# Patient Record
Sex: Male | Born: 1980 | Race: White | Hispanic: No | Marital: Married | Smoking: Former smoker
Health system: Southern US, Community
[De-identification: ages and names within clinical notes are randomized; demographics above are authoritative.]

## PROBLEM LIST (undated history)

## (undated) DIAGNOSIS — T7840XA Allergy, unspecified, initial encounter: Secondary | ICD-10-CM

## (undated) HISTORY — DX: Allergy, unspecified, initial encounter: T78.40XA

## (undated) HISTORY — PX: FRACTURE SURGERY: SHX138

---
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
## 2003-09-19 ENCOUNTER — Ambulatory Visit (HOSPITAL_BASED_OUTPATIENT_CLINIC_OR_DEPARTMENT_OTHER): Admission: RE | Admit: 2003-09-19 | Discharge: 2003-09-19 | Payer: Self-pay | Admitting: Orthopedic Surgery

## 2004-08-16 ENCOUNTER — Emergency Department (HOSPITAL_COMMUNITY): Admission: EM | Admit: 2004-08-16 | Discharge: 2004-08-16 | Payer: Self-pay | Admitting: Emergency Medicine

## 2004-11-20 ENCOUNTER — Ambulatory Visit (HOSPITAL_COMMUNITY): Admission: RE | Admit: 2004-11-20 | Discharge: 2004-11-20 | Payer: Self-pay | Admitting: Gastroenterology

## 2004-12-12 ENCOUNTER — Encounter: Admission: RE | Admit: 2004-12-12 | Discharge: 2004-12-12 | Payer: Self-pay | Admitting: Gastroenterology

## 2012-03-28 ENCOUNTER — Ambulatory Visit (INDEPENDENT_AMBULATORY_CARE_PROVIDER_SITE_OTHER): Payer: BC Managed Care – PPO | Admitting: Internal Medicine

## 2012-03-28 VITALS — BP 136/75 | HR 82 | Temp 98.4°F | Resp 18 | Ht 74.0 in | Wt 194.0 lb

## 2012-03-28 DIAGNOSIS — J329 Chronic sinusitis, unspecified: Secondary | ICD-10-CM

## 2012-03-28 DIAGNOSIS — R05 Cough: Secondary | ICD-10-CM

## 2012-03-28 MED ORDER — AMOXICILLIN 500 MG PO CAPS: 1000.0000 mg | ORAL_CAPSULE | Freq: Three times a day (TID) | ORAL | Status: DC

## 2012-03-28 NOTE — Progress Notes (Signed)
  Subjective:    Patient ID: Willie Moore, male    DOB: October 20, 1980, 32 y.o.   MRN: [id number]  HPI  Pt complains of a constant left sided HA since Thurs. Also having congestion and fatigue ST in the a.m. Ears popping. Early morning Productive cough, noticed blood in the mucus today. Cough not keeping pt awake at night. 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 Mild h/o of EIA  Childhood h/o GERD, but no problems as an adult. As a child his allergy symptoms and sinus problems were attributed to his GERD  Review of Systems     Objective:   Physical Exam BP 136/75  Pulse 82  Temp 98.4 F (36.9 C) (Oral)  Resp 18  Ht 6\' 2"  (1.88 m)  Wt 194 lb (87.998 kg)  BMI 24.91 kg/m2  Conjunctiva clear/TMs clear Nares boggy with purulent so on the right Maxillary area is tender Throat clear No nodes Chest clear        Assessment & Plan:  Sinusitis Cough Amox 1,000mg  BID x 10d Continue Mucinex Steam

## 2013-01-02 ENCOUNTER — Ambulatory Visit (INDEPENDENT_AMBULATORY_CARE_PROVIDER_SITE_OTHER): Payer: BC Managed Care – PPO | Admitting: Internal Medicine

## 2013-01-02 VITALS — BP 118/80 | HR 96 | Temp 98.4°F | Resp 16 | Ht 74.0 in | Wt 194.0 lb

## 2013-01-02 DIAGNOSIS — J329 Chronic sinusitis, unspecified: Secondary | ICD-10-CM

## 2013-01-02 DIAGNOSIS — J9801 Acute bronchospasm: Secondary | ICD-10-CM

## 2013-01-02 MED ORDER — AMOXICILLIN-POT CLAVULANATE 875-125 MG PO TABS: 1.0000 | ORAL_TABLET | Freq: Two times a day (BID) | ORAL | Status: DC

## 2013-01-02 MED ORDER — HYDROCODONE-HOMATROPINE 5-1.5 MG/5ML PO SYRP: 5.0000 mL | ORAL_SOLUTION | Freq: Four times a day (QID) | ORAL | Status: DC | PRN

## 2013-01-02 MED ORDER — PREDNISONE 20 MG PO TABS: 20.0000 mg | ORAL_TABLET | Freq: Every day | ORAL | Status: DC

## 2013-01-02 NOTE — Progress Notes (Signed)
Subjective:    Patient ID: Willie Moore, male    DOB: 11-11-1980, 32 y.o.   MRN: [id number]  HPI Cough has gotten better, but has not completely gone away since 1/14. Has been worse for last week. Had no voice for 3 days, sore throat, ear pain. Having a lot of mucous production in the morning. Dark brown and green, some blood. Little drainage down back of throat. No nasal drainage, feels dry. No nasal congestion or pressure.  No fever, sweating x 4-5 day, chills.Sneezes frequently. Very fatigued.  Sometimes feels SOB, feels heavy in chest with burning, tight muscles, wheezing comes and goes. Felt SOB while walking today. Chest felt heavy, went down to knees, saw stars and felt like he was blacking out. Lasted 5-10 seconds. Felt light headed when he stood up with tingling throughout whole body. No loss of bowels or bladder today. No nausea or vomiting. Cough wakes him up at night. Two to three years ago was driving and had same symptoms and was able to pull off to side of road. Lasted much longer than this episode. At that time, lost control of bladder. Did not seek medical care at that time  Throat feels better today. Still has GERD, doesn't take any meds. Always gassy and burps a lot.Has taken probiotics and watching diet with some relief.  Father and aunt have an uncurable disease, patient not sure of the name. He thinks he is now having similar symptoms with tremors.   Patient under a lot of stress, works 60 hours a week.  Review of Systems  Constitutional: Positive for chills, diaphoresis, activity change and fatigue. Negative for fever, appetite change and unexpected weight change.  HENT: Positive for ear pain, postnasal drip, sneezing, sore throat and voice change. Negative for congestion, rhinorrhea and sinus pressure.   Respiratory: Positive for cough, chest tightness, shortness of breath and wheezing.   Cardiovascular: Positive for chest pain.  Gastrointestinal: Positive for  abdominal pain. Negative for nausea and vomiting.  Neurological: Positive for light-headedness and numbness. Negative for headaches.  Psychiatric/Behavioral: Positive for sleep disturbance.       Objective:   Physical Exam  Nursing note and vitals reviewed. Constitutional: He is oriented to person, place, and time. He appears well-developed and well-nourished. No distress.  HENT:  Right Ear: External ear normal.  Left Ear: External ear normal.  Left ear with occlusive wax. Right ear with opaque TM, erythematous canal. Pharynx with thick, yellow mucous.  Eyes: Conjunctivae are normal. Right eye exhibits no discharge. Left eye exhibits no discharge.  Neck: Normal range of motion. Neck supple.  Cardiovascular: Normal rate, regular rhythm and normal heart sounds.   Pulmonary/Chest: Effort normal. No respiratory distress. He has wheezes. He has no rales. He exhibits no tenderness.  Upper anterior expiratory wheezes. Posterior chest clear.  Musculoskeletal: Normal range of motion. He exhibits no edema.  Lymphadenopathy:    He has no cervical adenopathy.  Neurological: He is alert and oriented to person, place, and time.  Skin: Skin is warm and dry. He is not diaphoretic.  Psychiatric: He has a normal mood and affect. His behavior is normal. Judgment and thought content normal.      Assessment & Plan:  Sinus infection  Bronchospasm  Meds ordered this encounter  Medications  . amoxicillin-clavulanate (AUGMENTIN) 875-125 MG per tablet    Sig: Take 1 tablet by mouth 2 (two) times daily.    Dispense:  20 tablet    Refill:  0  .  predniSONE (DELTASONE) 20 MG tablet    Sig: Take 1 tablet (20 mg total) by mouth daily. 3/3/2/2/1/1 single daily dose for 6 days    Dispense:  12 tablet    Refill:  0  . HYDROcodone-homatropine (HYCODAN) 5-1.5 MG/5ML syrup    Sig: Take 5 mLs by mouth every 6 (six) hours as needed for cough.    Dispense:  120 mL    Refill:  0   1- discussed diagnosis with  patient. Will take meds as prescribed. If no improvement in 2 weeks, return for follow up/labs. If he has another episode like today once infection cleared, return for follow up. Return sooner if worsening symptoms. 2- no tremor observed but will evaluate if this recurs  DG/RPD

## 2013-05-20 ENCOUNTER — Ambulatory Visit (INDEPENDENT_AMBULATORY_CARE_PROVIDER_SITE_OTHER): Payer: BC Managed Care – PPO | Admitting: Family Medicine

## 2013-05-20 ENCOUNTER — Ambulatory Visit: Payer: BC Managed Care – PPO

## 2013-05-20 VITALS — BP 128/62 | HR 87 | Temp 98.6°F | Resp 16 | Ht 73.75 in | Wt 190.6 lb

## 2013-05-20 DIAGNOSIS — S29012A Strain of muscle and tendon of back wall of thorax, initial encounter: Secondary | ICD-10-CM

## 2013-05-20 DIAGNOSIS — M549 Dorsalgia, unspecified: Secondary | ICD-10-CM

## 2013-05-20 DIAGNOSIS — M62830 Muscle spasm of back: Secondary | ICD-10-CM

## 2013-05-20 DIAGNOSIS — M5380 Other specified dorsopathies, site unspecified: Secondary | ICD-10-CM

## 2013-05-20 DIAGNOSIS — S239XXA Sprain of unspecified parts of thorax, initial encounter: Secondary | ICD-10-CM

## 2013-05-20 MED ORDER — CYCLOBENZAPRINE HCL 5 MG PO TABS: ORAL_TABLET | ORAL | Status: DC

## 2013-05-20 MED ORDER — HYDROCODONE-ACETAMINOPHEN 5-325 MG PO TABS: 1.0000 | ORAL_TABLET | Freq: Four times a day (QID) | ORAL | Status: DC | PRN

## 2013-05-20 MED ORDER — MELOXICAM 7.5 MG PO TABS: 7.5000 mg | ORAL_TABLET | Freq: Every day | ORAL | Status: DC

## 2013-05-20 NOTE — Progress Notes (Signed)
Subjective:    Patient ID: Willie Moore, male    DOB: Nov 13, 1980, 33 y.o.   MRN: [id number]  HPI Willie Moore is a 33 y.o. 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 3 weekends ago, after the exercise - back locked up for about 4 hrs NKI - treated with heat/massage first time. Improved after 4-6 hours, but always felt pinch. Continued to work out.    Picked up 32yo son today - felt pop in back - middle of back and to right, felt pain move up and down about 5 inches. Still sore now, same area as initial pain and area of remaining knot. Burning sensation and pain in area.   [place]I [place]   Tx: none  No bowel or bladder incontinence, no saddle anesthesia, no lower extremity weakness.   SH: Acupuncturistelectrical engineer.   There are no active problems to display for this patient.  Past Medical History  Diagnosis Date  . Allergy    Past Surgical History  Procedure Laterality Date  . Fracture surgery     No Known Allergies Prior to Admission medications   Medication Sig Start Date End Date Taking? Authorizing Provider  amoxicillin-clavulanate (AUGMENTIN) 875-125 MG per tablet Take 1 tablet by mouth 2 (two) times daily. 01/02/13   Tonye Pearsonobert P Doolittle, MD  HYDROcodone-homatropine [facility](HYCODAN) 5-1.5 MG/5ML syrup Take 5 mLs by mouth every 6 (six) hours as needed for cough. 01/02/13   Tonye Pearsonobert P Doolittle, MD  predniSONE (DELTASONE) 20 MG tablet Take 1 tablet (20 mg total) by mouth daily. 3/3/2/2/1/1 single daily dose for 6 days 01/02/13   Tonye Pearsonobert P Doolittle, MD   History   Social History  . Marital Status: Single    Spouse Name: N/A    Number of Children: N/A  . Years of Education: N/A   Occupational History  . Not on file.   Social History Main Topics  . Smoking status: Current Every Day Smoker  . Smokeless tobacco: Not on file  . Alcohol Use: Yes  . Drug Use: No  . Sexual Activity: Yes   Other Topics Concern  . Not on file    Social History Narrative  . No narrative on file    Review of Systems  Genitourinary: Negative for decreased urine volume, difficulty urinating and testicular pain.  Musculoskeletal: Positive for back pain and myalgias.  Skin: Negative for rash.  Neurological: Negative for weakness.       Objective:   Physical Exam  Vitals reviewed. Constitutional: He appears well-developed and well-nourished.  HENT:  Head: Normocephalic and atraumatic.  Neck: Normal range of motion.  Pulmonary/Chest: Effort normal.  Abdominal: Soft. There is no tenderness.  Musculoskeletal: He exhibits tenderness.       Thoracic back: He exhibits decreased range of motion, tenderness and spasm. He exhibits no bony tenderness.       Lumbar back: He exhibits tenderness and spasm. He exhibits normal range of motion and no bony tenderness.       Back:  Neurological: He is alert. He has normal strength. No sensory deficit.  Reflex Scores:      Patellar reflexes are 2+ on the right side and 2+ on the left side.      Achilles reflexes are 2+ on the right side and 2+ on the left side. Able to heel and toe walk without difficulty.  Skin: Skin is warm and dry.  Psychiatric: He has a  normal mood and affect. His behavior is normal.     [facility] reading (PRIMARY) by  Dr. Neva Seat: T spine: no acute findings.  LS spine: No acute findings        Assessment & Plan:   Willie Moore is a 33 y.o. male Back pain - Plan: DG Lumbar Spine Complete, DG Thoracic Spine 2 View, meloxicam (MOBIC) 7.5 MG tablet, HYDROcodone-acetaminophen (NORCO/VICODIN) 5-325 MG per tablet  Strain of mid-back - Plan: meloxicam (MOBIC) 7.5 MG tablet, HYDROcodone-acetaminophen (NORCO/VICODIN) 5-325 MG per tablet  Muscle spasm of back - Plan: cyclobenzaprine (FLEXERIL) 5 MG tablet  Paraspinal strain, spasm likely.  DDX includes HNP, but initial tx with heat/ice, gentle rom, mobic, flexeril and if needed hydrocodone (no recent use - denies  problem/intolerance with narcotic meds in past).  If not improving into next week, consider MRI vs prednisone trial. Sooner if worse.   Meds ordered this encounter  Medications  . cyclobenzaprine (FLEXERIL) 5 MG tablet    Sig: 1 pill by mouth up to every 8 hours as needed. Start with one pill by mouth each bedtime as needed due to sedation    Dispense:  15 tablet    Refill:  0  . meloxicam (MOBIC) 7.5 MG tablet    Sig: Take 1 tablet (7.5 mg total) by mouth daily.    Dispense:  30 tablet    Refill:  0  . HYDROcodone-acetaminophen (NORCO/VICODIN) 5-325 MG per tablet    Sig: Take 1 tablet by mouth every 6 (six) hours as needed for moderate pain.    Dispense:  15 tablet    Refill:  0   Patient Instructions  You likely have a sprained ligament or strained muscle in the back, which can lead to some muscle spasm as well. Try the mobic each morning (do not combine with other over the counter pain relievers), flexeril at night if needed.  Heat or ice to area as needed and the other treatments and exercises in the back care manual as tolerated. If needed for more severe pain - hydrocodone prescribed, but be careful combining this with other sedating medicines such as the muscle relaxant. Heat or ice to affected area, and gentle range of motion. Recheck in 5-7 days if not improving as may need MRI.   Return to the clinic or go to the nearest emergency room if any of your symptoms worsen or new symptoms occur.  Back Pain, Adult Low back pain is very common. About 1 in 5 people have back pain.The cause of low back pain is rarely dangerous. The pain often gets better over time.About half of people with a sudden onset of back pain feel better in just 2 weeks. About 8 in 10 people feel better by 6 weeks.  CAUSES Some common causes of back pain include:  Strain of the muscles or ligaments supporting the spine.  Wear and tear (degeneration) of the spinal discs.  Arthritis.  Direct injury to the  back. DIAGNOSIS Most of the time, the direct cause of low back pain is not known.However, back pain can be treated effectively even when the exact cause of the pain is unknown.Answering your caregiver's questions about your overall health and symptoms is one of the most accurate ways to make sure the cause of your pain is not dangerous. If your caregiver needs more information, he or she may order lab work or imaging tests (X-rays or MRIs).However, even if imaging tests show changes in your back, this usually does  not require surgery. HOME CARE INSTRUCTIONS For many people, back pain returns.Since low back pain is rarely dangerous, it is often a condition that people can learn to [facility] their own.   Remain active. It is stressful on the back to sit or stand in one place. Do not sit, drive, or stand in one place for more than 30 minutes at a time. Take short walks on level surfaces as soon as pain allows.Try to increase the length of time you walk each day.  Do not stay in bed.Resting more than 1 or 2 days can delay your recovery.  Do not avoid exercise or work.Your body is made to move.It is not dangerous to be active, even though your back may hurt.Your back will likely heal faster if you return to being active before your pain is gone.  Pay attention to your body when you bend and lift. Many people have less discomfortwhen lifting if they bend their knees, keep the load close to their bodies,and avoid twisting. Often, the most comfortable positions are those that put less stress on your recovering back.  Find a comfortable position to sleep. Use a firm mattress and lie on your side with your knees slightly bent. If you lie on your back, put a pillow under your knees.  Only take over-the-counter or prescription medicines as directed by your caregiver. Over-the-counter medicines to reduce pain and inflammation are often the most helpful.Your caregiver may prescribe muscle relaxant  drugs.These medicines help dull your pain so you can more quickly return to your normal activities and healthy exercise.  Put ice on the injured area.  Put ice in a plastic bag.  Place a towel between your skin and the bag.  Leave the ice on for 15-20 minutes, 03-04 times a day for the first 2 to 3 days. After that, ice and heat may be alternated to reduce pain and spasms.  Ask your caregiver about trying back exercises and gentle massage. This may be of some benefit.  Avoid feeling anxious or stressed.Stress increases muscle tension and can worsen back pain.It is important to recognize when you are anxious or stressed and learn ways to manage it.Exercise is a great option. SEEK MEDICAL CARE IF:  You have pain that is not relieved with rest or medicine.  You have pain that does not improve in 1 week.  You have new symptoms.  You are generally not feeling well. SEEK IMMEDIATE MEDICAL CARE IF:   You have pain that radiates from your back into your legs.  You develop new bowel or bladder control problems.  You have unusual weakness or numbness in your arms or legs.  You develop nausea or vomiting.  You develop abdominal pain.  You feel faint. Document Released: 03/02/2005 Document Revised: 09/01/2011 Document Reviewed: 07/21/2010 [facility] Patient Information 2014 [place], [place].

## 2013-05-20 NOTE — Patient Instructions (Signed)
You likely have a sprained ligament or strained muscle in the back, which can lead to some muscle spasm as well. Try the mobic each morning (do not combine with other over the counter pain relievers), flexeril at night if needed.  Heat or ice to area as needed and the other treatments and exercises in the back care manual as tolerated. If needed for more severe pain - hydrocodone prescribed, but be careful combining this with other sedating medicines such as the muscle relaxant. Heat or ice to affected area, and gentle range of motion. Recheck in 5-7 days if not improving as may need MRI.   Return to the clinic or go to the nearest emergency room if any of your symptoms worsen or new symptoms occur.  Back Pain, Adult Low back pain is very common. About 1 in 5 people have back pain.The cause of low back pain is rarely dangerous. The pain often gets better over time.About half of people with a sudden onset of back pain feel better in just 2 weeks. About 8 in 10 people feel better by 6 weeks.  CAUSES Some common causes of back pain include:  Strain of the muscles or ligaments supporting the spine.  Wear and tear (degeneration) of the spinal discs.  Arthritis.  Direct injury to the back. DIAGNOSIS Most of the time, the direct cause of low back pain is not known.However, back pain can be treated effectively even when the exact cause of the pain is unknown.Answering your caregiver's questions about your overall health and symptoms is one of the most accurate ways to make sure the cause of your pain is not dangerous. If your caregiver needs more information, he or she may order lab work or imaging tests (X-rays or MRIs).However, even if imaging tests show changes in your back, this usually does not require surgery. HOME CARE INSTRUCTIONS For many people, back pain returns.Since low back pain is rarely dangerous, it is often a condition that people can learn to [facility] their own.   Remain  active. It is stressful on the back to sit or stand in one place. Do not sit, drive, or stand in one place for more than 30 minutes at a time. Take short walks on level surfaces as soon as pain allows.Try to increase the length of time you walk each day.  Do not stay in bed.Resting more than 1 or 2 days can delay your recovery.  Do not avoid exercise or work.Your body is made to move.It is not dangerous to be active, even though your back may hurt.Your back will likely heal faster if you return to being active before your pain is gone.  Pay attention to your body when you bend and lift. Many people have less discomfortwhen lifting if they bend their knees, keep the load close to their bodies,and avoid twisting. Often, the most comfortable positions are those that put less stress on your recovering back.  Find a comfortable position to sleep. Use a firm mattress and lie on your side with your knees slightly bent. If you lie on your back, put a pillow under your knees.  Only take over-the-counter or prescription medicines as directed by your caregiver. Over-the-counter medicines to reduce pain and inflammation are often the most helpful.Your caregiver may prescribe muscle relaxant drugs.These medicines help dull your pain so you can more quickly return to your normal activities and healthy exercise.  Put ice on the injured area.  Put ice in a plastic bag.  Place  a towel between your skin and the bag.  Leave the ice on for 15-20 minutes, 03-04 times a day for the first 2 to 3 days. After that, ice and heat may be alternated to reduce pain and spasms.  Ask your caregiver about trying back exercises and gentle massage. This may be of some benefit.  Avoid feeling anxious or stressed.Stress increases muscle tension and can worsen back pain.It is important to recognize when you are anxious or stressed and learn ways to manage it.Exercise is a great option. SEEK MEDICAL CARE IF:  You  have pain that is not relieved with rest or medicine.  You have pain that does not improve in 1 week.  You have new symptoms.  You are generally not feeling well. SEEK IMMEDIATE MEDICAL CARE IF:   You have pain that radiates from your back into your legs.  You develop new bowel or bladder control problems.  You have unusual weakness or numbness in your arms or legs.  You develop nausea or vomiting.  You develop abdominal pain.  You feel faint. Document Released: 03/02/2005 Document Revised: 09/01/2011 Document Reviewed: 07/21/2010 [facility] Patient Information 2014 [place], [place].

## 2013-09-09 ENCOUNTER — Telehealth: Payer: Self-pay

## 2013-09-09 NOTE — Telephone Encounter (Signed)
PT STATES THAT HE WOULD LIKE A REFILL ON CHANTIX, HE HAS NOT USED IT IN ABOUT 5 YEARS. [facility] [phone]

## 2013-09-10 NOTE — Telephone Encounter (Signed)
LMOM that he has to come in

## 2013-09-10 NOTE — Telephone Encounter (Signed)
Needs OV to discuss

## 2014-04-08 ENCOUNTER — Ambulatory Visit (INDEPENDENT_AMBULATORY_CARE_PROVIDER_SITE_OTHER): Payer: BLUE CROSS/BLUE SHIELD | Admitting: Emergency Medicine

## 2014-04-08 VITALS — BP 106/64 | HR 98 | Temp 98.3°F | Resp 20 | Ht 73.5 in | Wt 187.2 lb

## 2014-04-08 DIAGNOSIS — K2100 Gastro-esophageal reflux disease with esophagitis, without bleeding: Secondary | ICD-10-CM

## 2014-04-08 DIAGNOSIS — J0140 Acute pansinusitis, unspecified: Secondary | ICD-10-CM

## 2014-04-08 DIAGNOSIS — J209 Acute bronchitis, unspecified: Secondary | ICD-10-CM

## 2014-04-08 DIAGNOSIS — J029 Acute pharyngitis, unspecified: Secondary | ICD-10-CM

## 2014-04-08 MED ORDER — PSEUDOEPHEDRINE-GUAIFENESIN ER 60-600 MG PO TB12: 1.0000 | ORAL_TABLET | Freq: Two times a day (BID) | ORAL | Status: DC

## 2014-04-08 MED ORDER — HYDROCOD POLST-CHLORPHEN POLST 10-8 MG/5ML PO LQCR: 5.0000 mL | Freq: Two times a day (BID) | ORAL | Status: DC | PRN

## 2014-04-08 MED ORDER — AMOXICILLIN-POT CLAVULANATE 875-125 MG PO TABS: 1.0000 | ORAL_TABLET | Freq: Two times a day (BID) | ORAL | Status: DC

## 2014-04-08 NOTE — Patient Instructions (Signed)

## 2014-04-08 NOTE — Addendum Note (Signed)
Addended by: Carmelina DaneANDERSON, Tayson Schnelle S on: 04/08/2014 04:02 PM   Modules accepted: Level of Service

## 2014-04-08 NOTE — Progress Notes (Signed)
[facility] [address], [place] [id number] [phone]  Date:  04/08/2014   Name:  Willie Moore   DOB:  [birth year]   MRN:  [id number]  PCP:  Tonye PearsonOLITTLE, ROBERT P, MD    Chief Complaint: Sore Throat   History of Present Illness:  Willie Moore is a 34 y.o. very pleasant male patient who presents with the following:  Ill for four days with nasal congestion and a purulent drainage.  Has pressure in the sinuses Pain in ears and sore throat Has cough productive of purulent sputum No fever but chills at times No nausea or vomiting.  No stool change No wheezing or shortness of breath No improvement with over the counter medications or other home remedies.  Denies other complaint or health concern today.   There are no active problems to display for this patient.   Past Medical History  Diagnosis Date  . Allergy     Past Surgical History  Procedure Laterality Date  . Fracture surgery      History  Substance Use Topics  . Smoking status: Current Every Day Smoker  . Smokeless tobacco: Never Used  . Alcohol Use: 0.0 oz/week    0 Not specified per week    Family History  Problem Relation Age of Onset  . Diabetes Mother     No Known Allergies  Medication list has been reviewed and updated.  No current outpatient prescriptions on file prior to visit.   No current facility-administered medications on file prior to visit.    Review of Systems:  As per HPI, otherwise negative.    Physical Examination: Filed Vitals:   04/08/14 1509  BP: 106/64  Pulse: 98  Temp: 98.3 F (36.8 C)  Resp: 20   Filed Vitals:   04/08/14 1509  Height: 6' 1.5" (1.867 m)  Weight: 187 lb 4 oz (84.936 kg)   Body mass index is 24.37 kg/(m^2). Ideal Body Weight: Weight in (lb) to have BMI = 25: 191.7  GEN: WDWN, NAD, Non-toxic, A & O x 3 HEENT: Atraumatic, Normocephalic. Neck supple. No masses, No LAD.  Intensely red throat Ears and Nose: No  external deformity. CV: RRR, No M/G/R. No JVD. No thrill. No extra heart sounds. PULM: CTA B, no wheezes, crackles, rhonchi. No retractions. No resp. distress. No accessory muscle use. ABD: S, NT, ND, +BS. No rebound. No HSM. EXTR: No c/c/e NEURO Normal gait.  PSYCH: Normally interactive. Conversant. Not depressed or anxious appearing.  Calm demeanor.    Assessment and Plan: Sinusitis Bronchitis Pharyngitis  augmentin mucinex  tussionex Signed,  Phillips OdorJeffery Theodora Lalanne, MD

## 2014-04-16 NOTE — Progress Notes (Signed)
LMOM to CB to make appt to est care.

## 2014-04-19 ENCOUNTER — Telehealth: Payer: Self-pay

## 2014-04-19 NOTE — Telephone Encounter (Signed)
-----   Message from Carmelina DaneJeffery S Anderson, MD sent at 04/08/2014  4:02 PM EST ----- Set up in 104 with regular provider

## 2014-04-19 NOTE — Progress Notes (Signed)
LMOM for patient to call with with appt date/time preference.

## 2015-02-11 ENCOUNTER — Encounter: Payer: Self-pay | Admitting: Internal Medicine

## 2015-02-24 ENCOUNTER — Ambulatory Visit (INDEPENDENT_AMBULATORY_CARE_PROVIDER_SITE_OTHER): Payer: BLUE CROSS/BLUE SHIELD | Admitting: Internal Medicine

## 2015-02-24 ENCOUNTER — Ambulatory Visit (INDEPENDENT_AMBULATORY_CARE_PROVIDER_SITE_OTHER): Payer: BLUE CROSS/BLUE SHIELD

## 2015-02-24 ENCOUNTER — Encounter: Payer: Self-pay | Admitting: Internal Medicine

## 2015-02-24 VITALS — BP 108/60 | HR 80 | Temp 98.6°F | Resp 16 | Ht 73.5 in | Wt 196.0 lb

## 2015-02-24 DIAGNOSIS — Z720 Tobacco use: Secondary | ICD-10-CM

## 2015-02-24 DIAGNOSIS — R059 Cough, unspecified: Secondary | ICD-10-CM

## 2015-02-24 DIAGNOSIS — R05 Cough: Secondary | ICD-10-CM

## 2015-02-24 DIAGNOSIS — J4520 Mild intermittent asthma, uncomplicated: Secondary | ICD-10-CM

## 2015-02-24 DIAGNOSIS — F17200 Nicotine dependence, unspecified, uncomplicated: Secondary | ICD-10-CM | POA: Insufficient documentation

## 2015-02-24 DIAGNOSIS — J189 Pneumonia, unspecified organism: Secondary | ICD-10-CM

## 2015-02-24 MED ORDER — AZITHROMYCIN 500 MG PO TABS: 500.0000 mg | ORAL_TABLET | Freq: Every day | ORAL | Status: DC

## 2015-02-24 MED ORDER — PREDNISONE 20 MG PO TABS: ORAL_TABLET | ORAL | Status: DC

## 2015-02-24 MED ORDER — HYDROCODONE-HOMATROPINE 5-1.5 MG/5ML PO SYRP: 5.0000 mL | ORAL_SOLUTION | Freq: Four times a day (QID) | ORAL | Status: DC | PRN

## 2015-02-24 NOTE — Progress Notes (Signed)
Subjective:  This chart was scribed for Willie Sia, MD by Willie Moore, Medical Scribe. This patient was seen in Room 2 and the patient's care was started at 11:30 AM.    Patient ID: Willie Moore, male    DOB: Sep 22, 1980, 34 y.o.   MRN: [id number] Chief Complaint  Patient presents with  . Chills    Over the past week he's had an episode of chills, vomiting that last for about 2 hours  . Night Sweats  . Sore Throat  . Shortness of Breath    States his chest has felt heavy and he has trouble breathing     HPI Willie Moore is a 34 y.o. male who presents to [facility] complaining of  sudden onset chills with cough, night sweats, sore throat and shortness of breath that started 6 days ago. He states that it started with an episode of chills and vomiting that lasted for about 2 hours. He's coughing with sore throat and ear pain that started today. He gets chills when he breathes heavily. He feels heavy chest tightness mostly in the morning. He denies asthma. He has h/o pneumonia 3 times, last time being 34 years old.  No asthma Smoker!!! Several sinus infec last 2-3 yr  There are no active problems to display for this patient.  No current outpatient prescriptions on file.    Review of Systems  Constitutional: Positive for chills, diaphoresis and fatigue. Negative for fever and appetite change.  HENT: Positive for ear pain and sore throat. Negative for congestion and sneezing.   Respiratory: Positive for cough, chest tightness and shortness of breath.   Gastrointestinal: Positive for vomiting. Negative for nausea.       Objective:   Physical Exam  Constitutional: He is oriented to person, place, and time. He appears well-developed and well-nourished. No distress.  HENT:  Head: Normocephalic and atraumatic.  Right Ear: Tympanic membrane and external ear normal.  Left Ear: Tympanic membrane and external ear normal.  Nose: Nose normal.  Mouth/Throat: Posterior oropharyngeal  erythema (mildly) present. No oropharyngeal exudate.  Eyes: Conjunctivae and EOM are normal. Pupils are equal, round, and reactive to light.  Neck: Neck supple. No thyromegaly present.  Cardiovascular: Normal rate, regular rhythm and normal heart sounds.   No murmur heard. Pulmonary/Chest: Effort normal. No respiratory distress. He has wheezes (forced expiration).  Rhonchi R base  Abdominal: There is no tenderness.  Musculoskeletal: Normal range of motion.  Lymphadenopathy:    He has no cervical adenopathy.  Neurological: He is alert and oriented to person, place, and time.  Skin: Skin is warm and dry.  Psychiatric: He has a normal mood and affect. His behavior is normal.  Nursing note and vitals reviewed.   BP 108/60 mmHg  Pulse 80  Temp(Src) 98.6 F (37 C) (Oral)  Resp 16  Ht 6' 1.5" (1.867 m)  Wt 196 lb (88.905 kg)  BMI 25.51 kg/m2  SpO2 99%  [facility] reading (PRIMARY) by Dr. Merla Riches : chest xray: increased markings right lower lobe     Assessment & Plan:  Cough - Plan: DG Chest 2 View  Pneumonia due to Mycoplasma pneumoniae  RAD (reactive airway disease), mild intermittent, uncomplicated  Smoker  Meds ordered this encounter  Medications  . azithromycin (ZITHROMAX) 500 MG tablet    Sig: Take 1 tablet (500 mg total) by mouth daily. For 5 d    Dispense:  5 tablet    Refill:  0  . predniSONE (DELTASONE) 20  MG tablet    Sig: 3/3/2/2/1/1 single daily dose for 6 days    Dispense:  12 tablet    Refill:  0  . HYDROcodone-homatropine (HYCODAN) 5-1.5 MG/5ML syrup    Sig: Take 5 mLs by mouth every 6 (six) hours as needed.    Dispense:  120 mL    Refill:  0   Close fu Needs smok cess but not ready   By signing my name below, I, Willie Moore, attest that this documentation has been prepared under the direction and in the presence of Willie Siaobert Morena Mckissack, MD. Electronically Signed: Stann Oresung-Kai Moore, Scribe. 02/24/2015 , 11:34 AM .  I have completed the patient encounter in  its entirety as documented by the scribe, with editing by me where necessary. Willie Moore, M.D.

## 2015-05-12 ENCOUNTER — Ambulatory Visit (INDEPENDENT_AMBULATORY_CARE_PROVIDER_SITE_OTHER): Payer: BLUE CROSS/BLUE SHIELD | Admitting: Emergency Medicine

## 2015-05-12 ENCOUNTER — Ambulatory Visit (INDEPENDENT_AMBULATORY_CARE_PROVIDER_SITE_OTHER): Payer: BLUE CROSS/BLUE SHIELD

## 2015-05-12 VITALS — BP 118/68 | HR 53 | Temp 97.9°F | Resp 12 | Ht 75.0 in | Wt 197.0 lb

## 2015-05-12 DIAGNOSIS — J040 Acute laryngitis: Secondary | ICD-10-CM | POA: Diagnosis not present

## 2015-05-12 DIAGNOSIS — R059 Cough, unspecified: Secondary | ICD-10-CM

## 2015-05-12 DIAGNOSIS — R05 Cough: Secondary | ICD-10-CM | POA: Diagnosis not present

## 2015-05-12 LAB — POCT CBC
GRANULOCYTE PERCENT: 58.5 % (ref 37–80)
HCT, POC: 39.9 % — AB (ref 43.5–53.7)
Hemoglobin: 14.3 g/dL (ref 14.1–18.1)
Lymph, poc: 2.8 (ref 0.6–3.4)
MCH: 31.1 pg (ref 27–31.2)
MCHC: 35.8 g/dL — AB (ref 31.8–35.4)
MCV: 86.6 fL (ref 80–97)
MID (CBC): 0.2 (ref 0–0.9)
MPV: 8.1 fL (ref 0–99.8)
PLATELET COUNT, POC: 173 10*3/uL (ref 142–424)
POC GRANULOCYTE: 4.3 (ref 2–6.9)
POC LYMPH %: 38.3 % (ref 10–50)
POC MID %: 3.2 % (ref 0–12)
RBC: 4.6 M/uL — AB (ref 4.69–6.13)
RDW, POC: 12.6 %
WBC: 7.3 10*3/uL (ref 4.6–10.2)

## 2015-05-12 LAB — POCT INFLUENZA A/B
INFLUENZA A, POC: NEGATIVE
INFLUENZA B, POC: NEGATIVE

## 2015-05-12 MED ORDER — DOXYCYCLINE HYCLATE 100 MG PO TABS: 100.0000 mg | ORAL_TABLET | Freq: Two times a day (BID) | ORAL | Status: DC

## 2015-05-12 MED ORDER — BENZONATATE 100 MG PO CAPS: 100.0000 mg | ORAL_CAPSULE | Freq: Three times a day (TID) | ORAL | Status: DC | PRN

## 2015-05-12 MED ORDER — VARENICLINE TARTRATE 0.5 MG X 11 & 1 MG X 42 PO MISC
ORAL | Status: DC
Start: 2015-05-12 — End: 2017-08-21

## 2015-05-12 MED ORDER — VARENICLINE TARTRATE 1 MG PO TABS: 1.0000 mg | ORAL_TABLET | Freq: Two times a day (BID) | ORAL | Status: DC

## 2015-05-12 NOTE — Patient Instructions (Addendum)
Because you received an x-ray today, you will receive an invoice from [facility]. Please contact [facility] at [phone] with questions or concerns regarding your invoice. Our billing staff will not be able to assist you with those questions. Laryngitis Laryngitis is inflammation of your vocal cords. This causes hoarseness, coughing, loss of voice, sore throat, or a dry throat. Your vocal cords are two bands of muscles that are found in your throat. When you speak, these cords come together and vibrate. These vibrations come out through your mouth as sound. When your vocal cords are inflamed, your voice sounds different. Laryngitis can be temporary (acute) or long-term (chronic). Most cases of acute laryngitis improve with time. Chronic laryngitis is laryngitis that lasts for more than three weeks. CAUSES Acute laryngitis may be caused by:  A viral infection.  Lots of talking, yelling, or singing. This is also called vocal strain.  Bacterial infections. Chronic laryngitis may be caused by:  Vocal strain.  Injury to your vocal cords.  Acid reflux (gastroesophageal reflux disease or GERD).  Allergies.  Sinus infection.  Smoking.  Alcohol abuse.  Breathing in chemicals or dust.  Growths on the vocal cords. RISK FACTORS Risk factors for laryngitis include:  Smoking.  Alcohol abuse.  Having allergies. SIGNS AND SYMPTOMS Symptoms of laryngitis may include:  Low, hoarse voice.  Loss of voice.  Dry cough.  Sore throat.  Stuffy nose. DIAGNOSIS Laryngitis may be diagnosed by:  Physical exam.  Throat culture.  Blood test.  Laryngoscopy. This procedure allows your health care provider to look at your vocal cords with a mirror or viewing tube. TREATMENT Treatment for laryngitis depends on what is causing it. Usually, treatment involves resting your voice and using medicines to soothe your throat. However, if your laryngitis is caused by a  bacterial infection, you may need to take antibiotic medicine. If your laryngitis is caused by a growth, you may need to have a procedure to remove it. HOME CARE INSTRUCTIONS  Drink enough fluid to keep your urine clear or pale yellow.  Breathe in moist air. Use a humidifier if you live in a dry climate.  Take medicines only as directed by your health care provider.  If you were prescribed an antibiotic medicine, finish it all even if you start to feel better.  Do not smoke cigarettes or electronic cigarettes. If you need help quitting, ask your health care provider.  Talk as little as possible. Also avoid whispering, which can cause vocal strain.  Write instead of talking. Do this until your voice is back to normal. SEEK MEDICAL CARE IF:  You have a fever.  You have increasing pain.  You have difficulty swallowing. SEEK IMMEDIATE MEDICAL CARE IF:  You cough up blood.  You have trouble breathing.   This information is not intended to replace advice given to you by your health care provider. Make sure you discuss any questions you have with your health care provider.   Document Released: 03/02/2005 Document Revised: 03/23/2014 Document Reviewed: 08/15/2013 Elsevier Interactive Patient Education 2016 Elsevier Inc[employer] Acute Bronchitis Bronchitis is inflammation of the airways that extend from the windpipe into the lungs (bronchi). The inflammation often causes mucus to develop. This leads to a cough, which is the most common symptom of bronchitis.  In acute bronchitis, the condition usually develops suddenly and goes away over time, usually in a couple weeks. Smoking, allergies, and asthma can make bronchitis worse. Repeated episodes of bronchitis may cause further lung problems.  CAUSES  Acute bronchitis is most often caused by the same virus that causes a cold. The virus can spread from person to person (contagious) through coughing, sneezing, and touching contaminated  objects. SIGNS AND SYMPTOMS   Cough.   Fever.   Coughing up mucus.   Body aches.   Chest congestion.   Chills.   Shortness of breath.   Sore throat.  DIAGNOSIS  Acute bronchitis is usually diagnosed through a physical exam. Your health care provider will also ask you questions about your medical history. Tests, such as chest X-rays, are sometimes done to rule out other conditions.  TREATMENT  Acute bronchitis usually goes away in a couple weeks. Oftentimes, no medical treatment is necessary. Medicines are sometimes given for relief of fever or cough. Antibiotic medicines are usually not needed but may be prescribed in certain situations. In some cases, an inhaler may be recommended to help reduce shortness of breath and control the cough. A cool mist vaporizer may also be used to help thin bronchial secretions and make it easier to clear the chest.  HOME CARE INSTRUCTIONS  Get plenty of rest.   Drink enough fluids to keep your urine clear or pale yellow (unless you have a medical condition that requires fluid restriction). Increasing fluids may help thin your respiratory secretions (sputum) and reduce chest congestion, and it will prevent dehydration.   Take medicines only as directed by your health care provider.  If you were prescribed an antibiotic medicine, finish it all even if you start to feel better.  Avoid smoking and secondhand smoke. Exposure to cigarette smoke or irritating chemicals will make bronchitis worse. If you are a smoker, consider using nicotine gum or skin patches to help control withdrawal symptoms. Quitting smoking will help your lungs heal faster.   Reduce the chances of another bout of acute bronchitis by washing your hands frequently, avoiding people with cold symptoms, and trying not to touch your hands to your mouth, nose, or eyes.   Keep all follow-up visits as directed by your health care provider.  SEEK MEDICAL CARE IF: Your symptoms  do not improve after 1 week of treatment.  SEEK IMMEDIATE MEDICAL CARE IF:  You develop an increased fever or chills.   You have chest pain.   You have severe shortness of breath.  You have bloody sputum.   You develop dehydration.  You faint or repeatedly feel like you are going to pass out.  You develop repeated vomiting.  You develop a severe headache. MAKE SURE YOU:   Understand these instructions.  Will watch your condition.  Will get help right away if you are not doing well or get worse.   This information is not intended to replace advice given to you by your health care provider. Make sure you discuss any questions you have with your health care provider.   Document Released: 04/09/2004 Document Revised: 03/23/2014 Document Reviewed: 08/23/2012 Elsevier Interactive Patient Education [employer]

## 2015-05-12 NOTE — Progress Notes (Signed)
This chart was scribed for Earl Lites, MD by [facility], medical scribe at [facility].The patient was seen in exam room 09 and the patient's care was started at 1:15 PM.  Chief Complaint:  Chief Complaint  Patient presents with  . Sinus Problem    sinces Tuseday  . Cough  . Sore Throat  . Nasal Congestion  . Ear Fullness    burning not sore   HPI: Willie Moore is a 35 y.o. male who reports to [facility] today complaining of sinus congestion and pressure for the past 5 days. On Tuesday, he first developed a sore throat and hoarseness. Since then he now has a deep dry cough, sinus congestion and pressure, an ear fullness which he describes as a burning sensation. Was producing a greenish/yellow nasal discharge but now it is clear. Taking aleve D for relief. He does not have a fever but does have chills and body aches. He does smoke about a 0.5 p.p.d.  Past Medical History  Diagnosis Date  . Allergy    Past Surgical History  Procedure Laterality Date  . Fracture surgery     Social History   Social History  . Marital Status: Single    Spouse Name: N/A  . Number of Children: N/A  . Years of Education: N/A   Social History Main Topics  . Smoking status: Current Every Day Smoker  . Smokeless tobacco: Never Used  . Alcohol Use: 0.0 oz/week    0 Standard drinks or equivalent per week  . Drug Use: No  . Sexual Activity: Yes   Other Topics Concern  . None   Social History Narrative   Family History  Problem Relation Age of Onset  . Diabetes Mother    No Known Allergies Prior to Admission medications   Medication Sig Start Date End Date Taking? Authorizing Provider  naproxen sodium (ANAPROX) 220 MG tablet Take 220 mg by mouth 2 (two) times daily with a meal.   Yes Historical Provider, MD  azithromycin (ZITHROMAX) 500 MG tablet Take 1 tablet (500 mg total) by mouth daily. For 5 d Patient not taking: Reported on 05/12/2015 02/24/15   Tonye Pearson, MD  HYDROcodone-homatropine [facility]) 5-1.5 MG/5ML syrup Take 5 mLs by mouth every 6 (six) hours as needed. Patient not taking: Reported on 05/12/2015 02/24/15   Tonye Pearson, MD  predniSONE (DELTASONE) 20 MG tablet 3/3/2/2/1/1 single daily dose for 6 days Patient not taking: Reported on 05/12/2015 02/24/15   Tonye Pearson, MD    ROS: The patient denies fevers, chills, night sweats, unintentional weight loss, chest pain, palpitations, wheezing, dyspnea on exertion, nausea, vomiting, abdominal pain, dysuria, hematuria, melena, numbness, weakness, or tingling.  All other systems have been reviewed and were otherwise negative with the exception of those mentioned in the HPI and as above.    PHYSICAL EXAM: Filed Vitals:   05/12/15 1132  BP: 118/68  Pulse: 53  Temp: 97.9 F (36.6 C)  Resp: 12   Body mass index is 24.62 kg/(m^2).  General: Alert, no acute distress HEENT:  Normocephalic, atraumatic, oropharynx patent, nasal congestion. Eye: [facility] Cardiovascular:  Regular rate and rhythm, no rubs murmurs or gallops.  No Carotid bruits, radial pulse intact. No pedal edema.  Respiratory: Clear to auscultation bilaterally.  No wheezes, rales,.  No cyanosis, no use of accessory musculature. occasional rhonchi Abdominal: No organomegaly, abdomen is soft and non-tender, positive bowel sounds.  No masses. Musculoskeletal:  Gait intact. No edema, tenderness Skin: No rashes. Neurologic: Facial musculature symmetric. Psychiatric: Patient acts appropriately throughout our interaction. Lymphatic: No cervical or submandibular lymphadenopathy Genitourinary/Anorectal: No acute findings  LABS: No results found for this or any previous visit.  Results for orders placed or performed in visit on 05/12/15  POCT CBC  Result Value Ref Range   WBC 7.3 4.6 - 10.2 K/uL   Lymph, poc 2.8 0.6 - 3.4   POC LYMPH PERCENT 38.3 10 - 50 %L   MID (cbc) 0.2 0 - 0.9   POC MID % 3.2 0 - 12  %M   POC Granulocyte 4.3 2 - 6.9   Granulocyte percent 58.5 37 - 80 %G   RBC 4.60 (A) 4.69 - 6.13 M/uL   Hemoglobin 14.3 14.1 - 18.1 g/dL   HCT, POC 16.1 (A) 09.6 - 53.7 %   MCV 86.6 80 - 97 fL   MCH, POC 31.1 27 - 31.2 pg   MCHC 35.8 (A) 31.8 - 35.4 g/dL   RDW, POC 04.5 %   Platelet Count, POC 173 142 - 424 K/uL   MPV 8.1 0 - 99.8 fL  POCT Influenza A/B  Result Value Ref Range   Influenza A, POC Negative Negative   Influenza B, POC Negative Negative   EKG/XRAY:   Primary read interpreted by Dr. Cleta Alberts at [facility]. No consolidated infiltrate seen.  ASSESSMENT/PLAN: We'll treat with doxycycline and Tessalon Perles. Patient was encouraged to stop smoking. Gross sideeffects, risk and benefits, and alternatives of medications d/w patient. Patient is aware that all medications have potential sideeffects and we are unable to predict every sideeffect or drug-drug interaction that may occur.  By signing my name below, I, Nadim Abuhashem, attest that this documentation has been prepared under the direction and in the presence of Earl Lites, MD.  Electronically Signed: Conchita Paris, medical scribe. 05/12/2015, 1:25 PM.  Lesle Chris MD 05/12/2015 1:13 PM

## 2015-05-15 ENCOUNTER — Ambulatory Visit (INDEPENDENT_AMBULATORY_CARE_PROVIDER_SITE_OTHER): Payer: BLUE CROSS/BLUE SHIELD | Admitting: Emergency Medicine

## 2015-05-15 ENCOUNTER — Ambulatory Visit (INDEPENDENT_AMBULATORY_CARE_PROVIDER_SITE_OTHER): Payer: BLUE CROSS/BLUE SHIELD

## 2015-05-15 VITALS — BP 112/82 | HR 99 | Temp 98.4°F | Resp 16 | Ht 75.0 in | Wt 194.0 lb

## 2015-05-15 DIAGNOSIS — R059 Cough, unspecified: Secondary | ICD-10-CM

## 2015-05-15 DIAGNOSIS — R05 Cough: Secondary | ICD-10-CM | POA: Diagnosis not present

## 2015-05-15 DIAGNOSIS — R112 Nausea with vomiting, unspecified: Secondary | ICD-10-CM | POA: Diagnosis not present

## 2015-05-15 DIAGNOSIS — R51 Headache: Secondary | ICD-10-CM | POA: Diagnosis not present

## 2015-05-15 DIAGNOSIS — J111 Influenza due to unidentified influenza virus with other respiratory manifestations: Secondary | ICD-10-CM

## 2015-05-15 DIAGNOSIS — R519 Headache, unspecified: Secondary | ICD-10-CM

## 2015-05-15 LAB — POCT CBC
GRANULOCYTE PERCENT: 75.7 % (ref 37–80)
HEMATOCRIT: 42.2 % — AB (ref 43.5–53.7)
Hemoglobin: 15.6 g/dL (ref 14.1–18.1)
Lymph, poc: 2 (ref 0.6–3.4)
MCH, POC: 31.8 pg — AB (ref 27–31.2)
MCHC: 37.1 g/dL — AB (ref 31.8–35.4)
MCV: 85.8 fL (ref 80–97)
MID (CBC): 0.5 (ref 0–0.9)
MPV: 9.2 fL (ref 0–99.8)
POC GRANULOCYTE: 7.6 — AB (ref 2–6.9)
POC LYMPH %: 19.4 % (ref 10–50)
POC MID %: 4.9 % (ref 0–12)
Platelet Count, POC: 135 10*3/uL — AB (ref 142–424)
RBC: 4.92 M/uL (ref 4.69–6.13)
RDW, POC: 12.8 %
WBC: 10.1 10*3/uL (ref 4.6–10.2)

## 2015-05-15 LAB — COMPLETE METABOLIC PANEL WITH GFR
ALBUMIN: 4.6 g/dL (ref 3.6–5.1)
ALK PHOS: 65 U/L (ref 40–115)
ALT: 17 U/L (ref 9–46)
AST: 36 U/L (ref 10–40)
BUN: 9 mg/dL (ref 7–25)
CALCIUM: 9 mg/dL (ref 8.6–10.3)
CO2: 28 mmol/L (ref 20–31)
Chloride: 97 mmol/L — ABNORMAL LOW (ref 98–110)
Creat: 1.15 mg/dL (ref 0.60–1.35)
GFR, EST NON AFRICAN AMERICAN: 83 mL/min (ref >=60)
Glucose, Bld: 92 mg/dL (ref 65–99)
POTASSIUM: 3.8 mmol/L (ref 3.5–5.3)
Sodium: 134 mmol/L — ABNORMAL LOW (ref 135–146)
Total Bilirubin: 0.5 mg/dL (ref 0.2–1.2)
Total Protein: 7.9 g/dL (ref 6.1–8.1)

## 2015-05-15 LAB — POCT INFLUENZA A/B
INFLUENZA A, POC: NEGATIVE
Influenza B, POC: NEGATIVE

## 2015-05-15 MED ORDER — AMOXICILLIN 875 MG PO TABS: 875.0000 mg | ORAL_TABLET | Freq: Two times a day (BID) | ORAL | Status: DC

## 2015-05-15 MED ORDER — OSELTAMIVIR PHOSPHATE 75 MG PO CAPS: 75.0000 mg | ORAL_CAPSULE | Freq: Two times a day (BID) | ORAL | Status: DC

## 2015-05-15 MED ORDER — ONDANSETRON 8 MG PO TBDP: 8.0000 mg | ORAL_TABLET | Freq: Three times a day (TID) | ORAL | Status: DC | PRN

## 2015-05-15 NOTE — Patient Instructions (Addendum)
Because you received an x-ray today, you will receive an invoice from [facility] Radiology. Please contact [facility]iology at [phone] with questions or concerns regarding your invoice. Our billing staff will not be able to assist you with those questions. Please stop your doxycycline. I have given you a different antibiotic to take for better sinus coverage. I have also sent in medications for nausea. You're given a note to be out of work. You will also be on Tamiflu twice a day.

## 2015-05-15 NOTE — Progress Notes (Addendum)
Subjective:  This chart was scribed for Lesle Chris MD, by Veverly Fells, at [facility].  This patient was seen in room 13 and the patient's care was started at 12:19 PM.    Patient ID: Willie Moore, male    DOB: 05-09-1980, 35 y.o.   MRN: [id number] Chief Complaint  Patient presents with  . Fever  . Emesis  . Night Sweats    HPI  HPI Comments: Willie Moore is a 35 y.o. male who presents to the [facility] complaining of a fever/night sweats and emesis. Patient was here three days ago and since then, he stopped smoking.  Two nights ago, he started vomiting and states that yesterday he had a fever and 2 episodes of "violent" body chills. He has a burning sensation in his throat and chest as well as  A heavy pressure sensation.  Patient states that he still has a sore throat, and doesn't feel like he is getting better. He has associated symptoms of abdominal soreness and sinus pressure.  Patients daughter had the flu last week (which lasted 5 days).   Patient Active Problem List   Diagnosis Date Noted  . Smoker 02/24/2015   Past Medical History  Diagnosis Date  . Allergy    Past Surgical History  Procedure Laterality Date  . Fracture surgery     No Known Allergies Prior to Admission medications   Medication Sig Start Date End Date Taking? Authorizing Provider  benzonatate (TESSALON) 100 MG capsule Take 1-2 capsules (100-200 mg total) by mouth 3 (three) times daily as needed for cough. 05/12/15  Yes Collene Gobble, MD  doxycycline (VIBRA-TABS) 100 MG tablet Take 1 tablet (100 mg total) by mouth 2 (two) times daily. 05/12/15  Yes Collene Gobble, MD  varenicline (CHANTIX CONTINUING MONTH PAK) 1 MG tablet Take 1 tablet (1 mg total) by mouth 2 (two) times daily. Patient not taking: Reported on 05/15/2015 05/12/15   Collene Gobble, MD  varenicline (CHANTIX STARTING MONTH PAK) 0.5 MG X 11 & 1 MG X 42 tablet Take one 0.5 mg tablet by mouth once  daily for 3 days, then increase to one 0.5 mg tablet twice daily for 4 days, then increase to one 1 mg tablet twice daily. Patient not taking: Reported on 05/15/2015 05/12/15   Collene Gobble, MD   Social History   Social History  . Marital Status: Single    Spouse Name: N/A  . Number of Children: N/A  . Years of Education: N/A   Occupational History  . Not on file.   Social History Main Topics  . Smoking status: Former Smoker    Quit date: 05/12/2015  . Smokeless tobacco: Never Used  . Alcohol Use: 0.0 oz/week    0 Standard drinks or equivalent per week  . Drug Use: No  . Sexual Activity: Yes   Other Topics Concern  . Not on file   Social History Narrative    Review of Systems  Constitutional: Positive for fever and chills.  HENT: Positive for congestion, postnasal drip, sinus pressure and sore throat. Negative for drooling, hearing loss and nosebleeds.   Eyes: Negative for pain, redness and itching.  Respiratory: Positive for cough. Negative for choking and shortness of breath.   Gastrointestinal: Positive for nausea, vomiting and abdominal pain.  Musculoskeletal: Negative for neck pain and neck stiffness.  Neurological: Negative for seizures, syncope and speech difficulty.  Objective:   Physical Exam Filed Vitals:   05/15/15 1132  BP: 98/60  Pulse: 93  Temp: 98.4 F (36.9 C)  TempSrc: Oral  Resp: 16  Height:  (1.905 m)  Weight: 194 lb (87.998 kg)  SpO2: 98%    CONSTITUTIONAL: Well developed/well nourished HEAD: Normocephalic/atraumatic EYES: EOMI/PERRL ENMT: Mucous membranes moist NECK: supple no meningeal signs SPINE/BACK:entire spine nontender CV: S1/S2 noted, no murmurs/rubs/gallops noted LUNGS: There are coarse rhonchi bilaterally with good air exchange., no apparent distress ABDOMEN: soft, nontender, no rebound or guarding, bowel sounds noted throughout abdomen GU:no cva tenderness NEURO: Pt is awake/alert/appropriate, moves all  extremitiesx4.  No facial droop.   EXTREMITIES: pulses normal/equal, full ROM SKIN: warm, color normal there are a few petechia around his eyes PSYCH: no abnormalities of mood noted, alert and oriented to situation Meds ordered this encounter  Medications  . amoxicillin (AMOXIL) 875 MG tablet    Sig: Take 1 tablet (875 mg total) by mouth 2 (two) times daily.    Dispense:  20 tablet    Refill:  0  . ondansetron (ZOFRAN-ODT) 8 MG disintegrating tablet    Sig: Take 1 tablet (8 mg total) by mouth every 8 (eight) hours as needed for nausea.    Dispense:  20 tablet    Refill:  0  . oseltamivir (TAMIFLU) 75 MG capsule    Sig: Take 1 capsule (75 mg total) by mouth 2 (two) times daily.    Dispense:  10 capsule    Refill:  0   Dg Sinus 1-2 Views  05/15/2015  CLINICAL DATA:  Fever and night sweats. EXAM: PARANASAL SINUSES - 1-2 VIEW COMPARISON:  No prior. FINDINGS: Mild mucosal thickening left ethmoidal sinus complex and right frontal sinus. No prominent sinus opacification or air-fluid level. Acute bony abnormality. Mastoids are clear. IMPRESSION: Mild mucosal thickening left ethmoidal sinus complex and right frontal sinus. No prominent sinus opacification or air-fluid levels. Electronically Signed   By: Maisie Fus  Register   On: 05/15/2015 12:37   Dg Chest 2 View  05/12/2015  CLINICAL DATA:  Sore throat and cough EXAM: CHEST  2 VIEW COMPARISON:  02/24/2015 FINDINGS: The heart size and mediastinal contours are within normal limits. Both lungs are clear. The visualized skeletal structures are unremarkable. IMPRESSION: No active cardiopulmonary disease. Electronically Signed   By: Alcide Clever M.D.   On: 05/12/2015 14:15   Results for orders placed or performed in visit on 05/15/15  POCT Influenza A/B  Result Value Ref Range   Influenza A, POC Negative Negative   Influenza B, POC Negative Negative   Results for orders placed or performed in visit on 05/15/15  POCT Influenza A/B  Result Value Ref  Range   Influenza A, POC Negative Negative   Influenza B, POC Negative Negative  POCT CBC  Result Value Ref Range   WBC 10.1 4.6 - 10.2 K/uL   Lymph, poc 2.0 0.6 - 3.4   POC LYMPH PERCENT 19.4 10 - 50 %L   MID (cbc) 0.5 0 - 0.9   POC MID % 4.9 0 - 12 %M   POC Granulocyte 7.6 (A) 2 - 6.9   Granulocyte percent 75.7 37 - 80 %G   RBC 4.92 4.69 - 6.13 M/uL   Hemoglobin 15.6 14.1 - 18.1 g/dL   HCT, POC 16.1 (A) 09.6 - 53.7 %   MCV 85.8 80 - 97 fL   MCH, POC 31.8 (A) 27 - 31.2 pg   MCHC 37.1 (A) 31.8 -  35.4 g/dL   RDW, POC 16.1 %   Platelet Count, POC 135 (A) 142 - 424 K/uL   MPV 9.2 0 - 99.8 fL       Assessment & Plan:  We'll stop his doxycycline. Sinus films show sinus congestion but no air-fluid levels. Flu tests are negative. We'll change to amoxicillin twice a day.cmet was done because of the vomiting. He was also given some Zofran to help. He will be on amoxicillin twice a day.I personally performed the services described in this documentation, which was scribed in my presence. The recorded information has been reviewed and is accurate.  Collene Gobble, MD

## 2015-05-16 ENCOUNTER — Telehealth: Payer: Self-pay | Admitting: Emergency Medicine

## 2015-05-16 NOTE — Telephone Encounter (Signed)
This may be a duplicate note please call and check on status. Liver kidney function tests are normal. Be sure he sees me tomorrow if he is not improving. If he has acute worsening or difficulty breathing he should present to the ER.

## 2015-05-20 NOTE — Telephone Encounter (Signed)
Yes

## 2017-08-21 ENCOUNTER — Emergency Department (HOSPITAL_COMMUNITY): Payer: Commercial Managed Care - PPO

## 2017-08-21 ENCOUNTER — Emergency Department (HOSPITAL_COMMUNITY)
Admission: EM | Admit: 2017-08-21 | Discharge: 2017-08-21 | Disposition: A | Discharge status: Home / Self Care | Payer: Commercial Managed Care - PPO | Attending: Emergency Medicine | Admitting: Emergency Medicine

## 2017-08-21 ENCOUNTER — Encounter (HOSPITAL_COMMUNITY): Payer: Self-pay | Admitting: *Deleted

## 2017-08-21 ENCOUNTER — Other Ambulatory Visit: Payer: Self-pay

## 2017-08-21 DIAGNOSIS — Z4801 Encounter for change or removal of surgical wound dressing: Secondary | ICD-10-CM | POA: Insufficient documentation

## 2017-08-21 DIAGNOSIS — R109 Unspecified abdominal pain: Secondary | ICD-10-CM | POA: Diagnosis not present

## 2017-08-21 DIAGNOSIS — T148XXA Other injury of unspecified body region, initial encounter: Secondary | ICD-10-CM

## 2017-08-21 LAB — COMPREHENSIVE METABOLIC PANEL
ALBUMIN: 3.7 g/dL (ref 3.5–5.0)
ALT: 108 U/L — ABNORMAL HIGH (ref 17–63)
ANION GAP: 10 (ref 5–15)
AST: 66 U/L — AB (ref 15–41)
Alkaline Phosphatase: 99 U/L (ref 38–126)
BILIRUBIN TOTAL: 0.5 mg/dL (ref 0.3–1.2)
BUN: 16 mg/dL (ref 6–20)
CO2: 27 mmol/L (ref 22–32)
Calcium: 9.5 mg/dL (ref 8.9–10.3)
Chloride: 103 mmol/L (ref 101–111)
Creatinine, Ser: 1.12 mg/dL (ref 0.61–1.24)
GFR calc Af Amer: >60 mL/min (ref >60)
GFR calc non Af Amer: >60 mL/min (ref >60)
GLUCOSE: 112 mg/dL — AB (ref 65–99)
POTASSIUM: 4.7 mmol/L (ref 3.5–5.1)
Sodium: 140 mmol/L (ref 135–145)
TOTAL PROTEIN: 7.8 g/dL (ref 6.5–8.1)

## 2017-08-21 LAB — CBC
HCT: 42.7 % (ref 39.0–52.0)
Hemoglobin: 14.2 g/dL (ref 13.0–17.0)
MCH: 29.5 pg (ref 26.0–34.0)
MCHC: 33.3 g/dL (ref 30.0–36.0)
MCV: 88.6 fL (ref 78.0–100.0)
Platelets: 250 10*3/uL (ref 150–400)
RBC: 4.82 MIL/uL (ref 4.22–5.81)
RDW: 12.2 % (ref 11.5–15.5)
WBC: 7.8 10*3/uL (ref 4.0–10.5)

## 2017-08-21 MED ORDER — SODIUM CHLORIDE 0.9 % IV BOLUS
500.0000 mL | Freq: Once | INTRAVENOUS | Status: AC
  Administered 2017-08-21: 500 mL via INTRAVENOUS

## 2017-08-21 MED ORDER — IOHEXOL 300 MG/ML  SOLN
100.0000 mL | Freq: Once | INTRAMUSCULAR | Status: AC | PRN
  Administered 2017-08-21: 100 mL via INTRAVENOUS

## 2017-08-21 MED ORDER — LIDOCAINE-EPINEPHRINE (PF) 2 %-1:200000 IJ SOLN
20.0000 mL | Freq: Once | INTRAMUSCULAR | Status: AC
  Administered 2017-08-21: 20 mL

## 2017-08-21 MED ORDER — MORPHINE SULFATE (PF) 4 MG/ML IV SOLN
4.0000 mg | Freq: Once | INTRAVENOUS | Status: AC
  Administered 2017-08-21: 4 mg via INTRAMUSCULAR
  Filled 2017-08-21: qty 1

## 2017-08-21 NOTE — ED Provider Notes (Signed)
[facility] EMERGENCY DEPARTMENT Provider Note   CSN: [id number] Arrival date & time: 08/21/17  1652     History   Chief Complaint Chief Complaint  Patient presents with  . Wound Check    HPI Willie Moore is a 37 y.o. male who presents with bleeding pilonidal abscess.  Patient presents after having packing changed for an abscess that was drained 3 days ago at urgent care.  He reports he had the packing changed around 1 PM today and bleeding began about 2 hours prior to arrival.  Patient persists to have pain in the area.  He has been taking Bactrim.  Prior to today, patient was only having purulent drainage and no significant bleeding.  Patient has never had an abscess before.  He denies fevers, chest pain, shortness of breath, nausea, vomiting.  He reports he has had some abdominal pressure.  Patient denies any rectal pain.  HPI  Past Medical History:  Diagnosis Date  . Allergy     Patient Active Problem List   Diagnosis Date Noted  . Smoker 02/24/2015    Past Surgical History:  Procedure Laterality Date  . FRACTURE SURGERY          Home Medications    Prior to Admission medications   Medication Sig Start Date End Date Taking? Authorizing Provider  sulfamethoxazole-trimethoprim (BACTRIM DS,SEPTRA DS) 800-160 MG tablet Take 1 tablet by mouth 2 (two) times daily. For ten days 08/19/17  Yes [provider]    Family History Family History  Problem Relation Age of Onset  . Diabetes Mother     Social History Social History   Tobacco Use  . Smoking status: Former Smoker    Last attempt to quit: 05/12/2015    Years since quitting: 2.2  . Smokeless tobacco: Never Used  Substance Use Topics  . Alcohol use: Yes    Alcohol/week: 0.0 oz  . Drug use: No     Allergies   Patient has no known allergies.   Review of Systems Review of Systems  Constitutional: Negative for chills and fever.  HENT: Negative for facial swelling and sore  throat.   Respiratory: Negative for shortness of breath.   Cardiovascular: Negative for chest pain.  Gastrointestinal: Positive for abdominal pain. Negative for nausea and vomiting.  Genitourinary: Negative for dysuria.  Musculoskeletal: Negative for back pain.  Skin: Positive for wound. Negative for rash.  Neurological: Negative for headaches.  Psychiatric/Behavioral: The patient is not nervous/anxious.      Physical Exam Updated Vital Signs BP 110/60 (BP Location: Right Arm)   Pulse 62   Temp 98.2 F (36.8 C) (Oral)   Resp 16   Ht 6\' 3"  (1.905 m)   Wt 95.3 kg (210 lb)   SpO2 98%   BMI 26.25 kg/m   Physical Exam  Constitutional: He appears well-developed and well-nourished. No distress.  HENT:  Head: Normocephalic and atraumatic.  Mouth/Throat: Oropharynx is clear and moist. No oropharyngeal exudate.  Eyes: Pupils are equal, round, and reactive to light. Conjunctivae are normal. Right eye exhibits no discharge. Left eye exhibits no discharge. No scleral icterus.  Neck: Normal range of motion. Neck supple. No thyromegaly present.  Cardiovascular: Normal rate, regular rhythm, normal heart sounds and intact distal pulses. Exam reveals no gallop and no friction rub.  No murmur heard. Pulmonary/Chest: Effort normal and breath sounds normal. No stridor. No respiratory distress. He has no wheezes. He has no rales.  Abdominal: Soft. Bowel sounds are  normal. He exhibits no distension. There is no tenderness. There is no rebound and no guarding.  Musculoskeletal: He exhibits no edema.       Back:  Lymphadenopathy:    He has no cervical adenopathy.  Neurological: He is alert. Coordination normal.  Skin: Skin is warm and dry. No rash noted. He is not diaphoretic. No pallor.  Psychiatric: He has a normal mood and affect.  Nursing note and vitals reviewed.    ED Treatments / Results  Labs (all labs ordered are listed, but only abnormal results are displayed) Labs Reviewed    COMPREHENSIVE METABOLIC PANEL - Abnormal; Notable for the following components:      Result Value   Glucose, Bld 112 (*)    AST 66 (*)    ALT 108 (*)    All other components within normal limits  CBC    EKG None  Radiology Ct Abdomen Pelvis W Contrast  Result Date: 08/21/2017 CLINICAL DATA:  Rectal bleeding since this afternoon. EXAM: CT ABDOMEN AND PELVIS WITH CONTRAST TECHNIQUE: Multidetector CT imaging of the abdomen and pelvis was performed using the standard protocol following bolus administration of intravenous contrast. CONTRAST:  100mL OMNIPAQUE IOHEXOL 300 MG/ML  SOLN COMPARISON:  None. FINDINGS: Lower chest: Normal. Hepatobiliary: Normal. Pancreas: Normal. Spleen: Normal Adrenals/Urinary Tract: Adrenal glands are normal. Kidneys normal in size without hydronephrosis or nephrolithiasis. Stomach/Bowel: Stomach and small bowel are normal. Appendix is normal. Colon is normal. Vascular/Lymphatic: Within normal. Reproductive: Normal. Other: No free fluid or focal inflammatory change. No visible abnormality over the anal rectal region. Musculoskeletal: Normal. IMPRESSION: No acute findings in the abdomen/pelvis. Electronically Signed   By: Elberta Fortisaniel  Boyle M.D.   On: 08/21/2017 19:58    Procedures Procedures (including critical care time)  2 mL of lidocaine 2% with epinephrine injected into the wound subcutaneously by Dr. Jeraldine LootsLockwood applied pressure to the area of pulsating blood.  This controlled bleeding.  Wound was dressed and bleeding did not recur throughout ED course.  Medications Ordered in ED Medications  lidocaine-EPINEPHrine (XYLOCAINE W/EPI) 2 %-1:200000 (PF) injection 20 mL (20 mLs Infiltration Given 08/21/17 1830)  morphine 4 MG/ML injection 4 mg (4 mg Intramuscular Given 08/21/17 1829)  sodium chloride 0.9 % bolus 500 mL (0 mLs Intravenous Stopped 08/21/17 2054)  iohexol (OMNIPAQUE) 300 MG/ML solution 100 mL (100 mLs Intravenous Contrast Given 08/21/17 1911)     Initial  Impression / Assessment and Plan / ED Course  I have reviewed the triage vital signs and the nursing notes.  Pertinent labs & imaging results that were available during my care of the patient were reviewed by me and considered in my medical decision making (see chart for details).     Patient presenting with bleeding from previously drained abscess.  Packing was replaced today at urgent care and a couple hours later patient began to have significant bleeding.  Bleeding was controlled with lidocaine with epinephrine injected into the site as well as pressure.  Bleeding did not recur throughout patient's ED course.  CT abdomen pelvis is negative, no significant abscess seen.  CBC unremarkable.  CMP shows mild elevation in AST and ALT.  Patient has follow-up with urgent care for wound check and packing change in 1 day.  Return precautions discussed.  Patient understands and agrees with plan.  Patient vitals stable throughout ED course and discharged in satisfactory condition.  Patient also evaluated by Dr. Jeraldine LootsLockwood who guided the patient's management and agrees with plan.  Final Clinical Impressions(s) /  ED Diagnoses   Final diagnoses:  Bleeding from wound    ED Discharge Orders    None       Emi Holes, PA-C 08/22/17 0057    Gerhard Munch, MD 08/23/17 [phone]

## 2017-08-21 NOTE — ED Triage Notes (Signed)
[facility] after he had a packing removed from his rectal area and the area was re[packed  He had the packing  Placed around 1300 and he has been bleeding sionce then from his rectum  He has bled through the  Bandage numerus times since then he has bled through his pants now

## 2017-08-21 NOTE — ED Notes (Signed)
Patient transported to CT 

## 2017-08-21 NOTE — ED Triage Notes (Signed)
He had an abscess drained from his rectal area this past htujrsday

## 2017-08-21 NOTE — Discharge Instructions (Addendum)
As discussed, your evaluation today has been largely reassuring.  But, it is important that you monitor your condition carefully, and do not hesitate to return to the ED if you develop new, or concerning changes in your condition. ? ?Otherwise, please follow-up with your physician for appropriate ongoing care. ? ?

## 2017-08-21 NOTE — ED Notes (Signed)
Pt verbalized understanding of d/c instructions and has no further questions, VSS, NAD.  

## 2017-11-13 ENCOUNTER — Emergency Department (HOSPITAL_COMMUNITY): Payer: Commercial Managed Care - PPO | Admitting: Anesthesiology

## 2017-11-13 ENCOUNTER — Emergency Department (HOSPITAL_COMMUNITY): Payer: Commercial Managed Care - PPO

## 2017-11-13 ENCOUNTER — Ambulatory Visit (INDEPENDENT_AMBULATORY_CARE_PROVIDER_SITE_OTHER)
Admission: EM | Admit: 2017-11-13 | Discharge: 2017-11-13 | Disposition: A | Discharge status: Home / Self Care | Payer: Commercial Managed Care - PPO | Source: Home / Self Care | Attending: Family Medicine | Admitting: Family Medicine

## 2017-11-13 ENCOUNTER — Encounter (HOSPITAL_COMMUNITY)
Admission: EM | Disposition: A | Discharge status: Home / Self Care | Payer: Self-pay | Source: Home / Self Care | Attending: Emergency Medicine

## 2017-11-13 ENCOUNTER — Observation Stay (HOSPITAL_COMMUNITY)
Admission: EM | Admit: 2017-11-13 | Discharge: 2017-11-16 | Disposition: A | Discharge status: Home / Self Care | Payer: Commercial Managed Care - PPO | Attending: Surgery | Admitting: Surgery

## 2017-11-13 ENCOUNTER — Encounter (HOSPITAL_COMMUNITY): Payer: Self-pay | Admitting: Emergency Medicine

## 2017-11-13 ENCOUNTER — Other Ambulatory Visit: Payer: Self-pay

## 2017-11-13 DIAGNOSIS — Z87891 Personal history of nicotine dependence: Secondary | ICD-10-CM | POA: Diagnosis not present

## 2017-11-13 DIAGNOSIS — K611 Rectal abscess: Secondary | ICD-10-CM | POA: Diagnosis not present

## 2017-11-13 DIAGNOSIS — R110 Nausea: Secondary | ICD-10-CM

## 2017-11-13 DIAGNOSIS — L03317 Cellulitis of buttock: Secondary | ICD-10-CM

## 2017-11-13 DIAGNOSIS — L0291 Cutaneous abscess, unspecified: Secondary | ICD-10-CM

## 2017-11-13 DIAGNOSIS — L0501 Pilonidal cyst with abscess: Secondary | ICD-10-CM | POA: Diagnosis not present

## 2017-11-13 HISTORY — PX: INCISION AND DRAINAGE ABSCESS: SHX5864

## 2017-11-13 LAB — COMPREHENSIVE METABOLIC PANEL
ALK PHOS: 91 U/L (ref 38–126)
ALT: 76 U/L — AB (ref 0–44)
AST: 78 U/L — ABNORMAL HIGH (ref 15–41)
Albumin: 4 g/dL (ref 3.5–5.0)
Anion gap: 12 (ref 5–15)
BILIRUBIN TOTAL: 1.1 mg/dL (ref 0.3–1.2)
BUN: 10 mg/dL (ref 6–20)
CALCIUM: 9.1 mg/dL (ref 8.9–10.3)
CO2: 26 mmol/L (ref 22–32)
CREATININE: 1.1 mg/dL (ref 0.61–1.24)
Chloride: 97 mmol/L — ABNORMAL LOW (ref 98–111)
GFR calc Af Amer: >60 mL/min (ref >60)
Glucose, Bld: 102 mg/dL — ABNORMAL HIGH (ref 70–99)
Potassium: 3.8 mmol/L (ref 3.5–5.1)
Sodium: 135 mmol/L (ref 135–145)
Total Protein: 7.9 g/dL (ref 6.5–8.1)

## 2017-11-13 LAB — CBC WITH DIFFERENTIAL/PLATELET
ABS IMMATURE GRANULOCYTES: 0.1 10*3/uL (ref 0.0–0.1)
BASOS PCT: 0 %
Basophils Absolute: 0.1 10*3/uL (ref 0.0–0.1)
Eosinophils Absolute: 0.2 10*3/uL (ref 0.0–0.7)
Eosinophils Relative: 1 %
HEMATOCRIT: 45.6 % (ref 39.0–52.0)
HEMOGLOBIN: 15.1 g/dL (ref 13.0–17.0)
Immature Granulocytes: 0 %
LYMPHS ABS: 2.8 10*3/uL (ref 0.7–4.0)
LYMPHS PCT: 17 %
MCH: 29.7 pg (ref 26.0–34.0)
MCHC: 33.1 g/dL (ref 30.0–36.0)
MCV: 89.8 fL (ref 78.0–100.0)
MONO ABS: 1 10*3/uL (ref 0.1–1.0)
MONOS PCT: 6 %
NEUTROS PCT: 76 %
Neutro Abs: 12.9 10*3/uL — ABNORMAL HIGH (ref 1.7–7.7)
Platelets: 169 10*3/uL (ref 150–400)
RBC: 5.08 MIL/uL (ref 4.22–5.81)
RDW: 12.2 % (ref 11.5–15.5)
WBC: 17 10*3/uL — ABNORMAL HIGH (ref 4.0–10.5)

## 2017-11-13 LAB — URINALYSIS, ROUTINE W REFLEX MICROSCOPIC
BILIRUBIN URINE: NEGATIVE
GLUCOSE, UA: NEGATIVE mg/dL
KETONES UR: 80 mg/dL — AB
Leukocytes, UA: NEGATIVE
Nitrite: NEGATIVE
PROTEIN: NEGATIVE mg/dL
Specific Gravity, Urine: >1.046 — ABNORMAL HIGH (ref 1.005–1.030)
pH: 5 (ref 5.0–8.0)

## 2017-11-13 LAB — I-STAT CG4 LACTIC ACID, ED: LACTIC ACID, VENOUS: 1.57 mmol/L (ref 0.5–1.9)

## 2017-11-13 SURGERY — INCISION AND DRAINAGE, ABSCESS
Anesthesia: General | Site: Buttocks

## 2017-11-13 MED ORDER — PROMETHAZINE HCL 25 MG/ML IJ SOLN: 6.2500 mg | INTRAMUSCULAR | Status: DC | PRN

## 2017-11-13 MED ORDER — IOPAMIDOL (ISOVUE-300) INJECTION 61%
INTRAVENOUS | Status: AC
  Administered 2017-11-13: 100 mL
  Filled 2017-11-13: qty 100

## 2017-11-13 MED ORDER — ONDANSETRON 4 MG PO TBDP
ORAL_TABLET | ORAL | Status: AC
  Filled 2017-11-13: qty 1

## 2017-11-13 MED ORDER — FENTANYL CITRATE (PF) 100 MCG/2ML IJ SOLN
INTRAMUSCULAR | Status: DC | PRN
  Administered 2017-11-13: 50 ug via INTRAVENOUS
  Administered 2017-11-13: 100 ug via INTRAVENOUS

## 2017-11-13 MED ORDER — DEXAMETHASONE SODIUM PHOSPHATE 10 MG/ML IJ SOLN
INTRAMUSCULAR | Status: DC | PRN
  Administered 2017-11-13: 10 mg via INTRAVENOUS

## 2017-11-13 MED ORDER — CLINDAMYCIN PHOSPHATE 600 MG/50ML IV SOLN
600.0000 mg | Freq: Once | INTRAVENOUS | Status: AC
  Administered 2017-11-13: 600 mg via INTRAVENOUS
  Filled 2017-11-13: qty 50

## 2017-11-13 MED ORDER — MORPHINE SULFATE (PF) 2 MG/ML IV SOLN: 4.0000 mg | Freq: Once | INTRAVENOUS | Status: DC

## 2017-11-13 MED ORDER — ONDANSETRON 4 MG PO TBDP
4.0000 mg | ORAL_TABLET | Freq: Once | ORAL | Status: AC
  Administered 2017-11-13: 4 mg via ORAL

## 2017-11-13 MED ORDER — SODIUM CHLORIDE 0.9 % IV BOLUS
1000.0000 mL | Freq: Once | INTRAVENOUS | Status: AC
  Administered 2017-11-13: 1000 mL via INTRAVENOUS

## 2017-11-13 MED ORDER — MORPHINE SULFATE (PF) 4 MG/ML IV SOLN
INTRAVENOUS | Status: AC
  Filled 2017-11-13: qty 1

## 2017-11-13 MED ORDER — FENTANYL CITRATE (PF) 250 MCG/5ML IJ SOLN
INTRAMUSCULAR | Status: AC
  Filled 2017-11-13: qty 5

## 2017-11-13 MED ORDER — LACTATED RINGERS IV SOLN
INTRAVENOUS | Status: DC | PRN
  Administered 2017-11-13: 23:00:00 via INTRAVENOUS

## 2017-11-13 MED ORDER — PROPOFOL 10 MG/ML IV BOLUS
INTRAVENOUS | Status: AC
  Filled 2017-11-13: qty 20

## 2017-11-13 MED ORDER — ONDANSETRON HCL 4 MG/2ML IJ SOLN
4.0000 mg | Freq: Once | INTRAMUSCULAR | Status: AC
  Administered 2017-11-13: 4 mg via INTRAVENOUS
  Filled 2017-11-13: qty 2

## 2017-11-13 MED ORDER — ONDANSETRON HCL 4 MG/2ML IJ SOLN
INTRAMUSCULAR | Status: DC | PRN
  Administered 2017-11-13: 4 mg via INTRAVENOUS

## 2017-11-13 MED ORDER — 0.9 % SODIUM CHLORIDE (POUR BTL) OPTIME
TOPICAL | Status: DC | PRN
  Administered 2017-11-13: 1000 mL

## 2017-11-13 MED ORDER — FENTANYL CITRATE (PF) 100 MCG/2ML IJ SOLN: 25.0000 ug | INTRAMUSCULAR | Status: DC | PRN

## 2017-11-13 MED ORDER — HYDROMORPHONE HCL 1 MG/ML IJ SOLN
1.0000 mg | Freq: Once | INTRAMUSCULAR | Status: AC
  Administered 2017-11-13: 1 mg via INTRAVENOUS
  Filled 2017-11-13: qty 1

## 2017-11-13 MED ORDER — SUCCINYLCHOLINE CHLORIDE 20 MG/ML IJ SOLN
INTRAMUSCULAR | Status: DC | PRN
  Administered 2017-11-13: 120 mg via INTRAVENOUS

## 2017-11-13 MED ORDER — PROPOFOL 10 MG/ML IV BOLUS
INTRAVENOUS | Status: AC
  Filled 2017-11-13: qty 40

## 2017-11-13 MED ORDER — MIDAZOLAM HCL 2 MG/2ML IJ SOLN
INTRAMUSCULAR | Status: AC
  Filled 2017-11-13: qty 2

## 2017-11-13 MED ORDER — LIDOCAINE HCL 2 % IJ SOLN
INTRAMUSCULAR | Status: AC
  Filled 2017-11-13: qty 20

## 2017-11-13 MED ORDER — MIDAZOLAM HCL 5 MG/5ML IJ SOLN
INTRAMUSCULAR | Status: DC | PRN
  Administered 2017-11-13: 1 mg via INTRAVENOUS

## 2017-11-13 MED ORDER — PROPOFOL 10 MG/ML IV BOLUS
INTRAVENOUS | Status: DC | PRN
  Administered 2017-11-13: 200 mg via INTRAVENOUS

## 2017-11-13 MED ORDER — MORPHINE SULFATE (PF) 4 MG/ML IV SOLN
4.0000 mg | INTRAVENOUS | Status: AC
  Administered 2017-11-13: 4 mg via INTRAVENOUS

## 2017-11-13 MED ORDER — LIDOCAINE 2% (20 MG/ML) 5 ML SYRINGE
INTRAMUSCULAR | Status: DC | PRN
  Administered 2017-11-13: 100 mg via INTRAVENOUS

## 2017-11-13 MED ORDER — HYDROMORPHONE HCL 1 MG/ML IJ SOLN
1.0000 mg | Freq: Once | INTRAMUSCULAR | Status: AC
Start: 2017-11-13 — End: 2017-11-13
  Administered 2017-11-13: 1 mg via INTRAVENOUS
  Filled 2017-11-13: qty 1

## 2017-11-13 SURGICAL SUPPLY — 32 items
BLADE CLIPPER SURG (BLADE) IMPLANT
BNDG GAUZE ELAST 4 BULKY (GAUZE/BANDAGES/DRESSINGS) IMPLANT
CANISTER SUCT 3000ML PPV (MISCELLANEOUS) ×3 IMPLANT
CHLORAPREP W/TINT 26ML (MISCELLANEOUS) IMPLANT
COVER SURGICAL LIGHT HANDLE (MISCELLANEOUS) ×3 IMPLANT
DRAIN PENROSE 1/2X12 LTX STRL (WOUND CARE) ×2 IMPLANT
DRAPE LAPAROSCOPIC ABDOMINAL (DRAPES) IMPLANT
DRAPE LAPAROTOMY 100X72 PEDS (DRAPES) IMPLANT
DRSG PAD ABDOMINAL 8X10 ST (GAUZE/BANDAGES/DRESSINGS) IMPLANT
ELECT CAUTERY BLADE 6.4 (BLADE) ×3 IMPLANT
ELECT REM PT RETURN 9FT ADLT (ELECTROSURGICAL) ×3
ELECTRODE REM PT RTRN 9FT ADLT (ELECTROSURGICAL) ×1 IMPLANT
GAUZE SPONGE 4X4 12PLY STRL (GAUZE/BANDAGES/DRESSINGS) IMPLANT
GAUZE SPONGE 4X4 12PLY STRL LF (GAUZE/BANDAGES/DRESSINGS) ×2 IMPLANT
GLOVE BIO SURGEON STRL SZ7 (GLOVE) ×3 IMPLANT
GLOVE BIOGEL PI IND STRL 7.5 (GLOVE) ×1 IMPLANT
GLOVE BIOGEL PI INDICATOR 7.5 (GLOVE) ×2
GOWN STRL REUS W/ TWL LRG LVL3 (GOWN DISPOSABLE) ×2 IMPLANT
GOWN STRL REUS W/TWL LRG LVL3 (GOWN DISPOSABLE) ×6
KIT BASIN OR (CUSTOM PROCEDURE TRAY) ×3 IMPLANT
KIT TURNOVER KIT B (KITS) ×3 IMPLANT
NS IRRIG 1000ML POUR BTL (IV SOLUTION) ×3 IMPLANT
PACK GENERAL/GYN (CUSTOM PROCEDURE TRAY) ×3 IMPLANT
PAD ABD 8X10 STRL (GAUZE/BANDAGES/DRESSINGS) ×2 IMPLANT
PAD ARMBOARD 7.5X6 YLW CONV (MISCELLANEOUS) ×3 IMPLANT
SOLUTION BETADINE 4OZ (MISCELLANEOUS) ×2 IMPLANT
SUT ETHILON 2 0 FS 18 (SUTURE) ×2 IMPLANT
SWAB COLLECTION DEVICE MRSA (MISCELLANEOUS) IMPLANT
SWAB CULTURE ESWAB REG 1ML (MISCELLANEOUS) IMPLANT
TAPE CLOTH SURG 4X10 WHT LF (GAUZE/BANDAGES/DRESSINGS) ×2 IMPLANT
TOWEL OR 17X24 6PK STRL BLUE (TOWEL DISPOSABLE) ×3 IMPLANT
TOWEL OR 17X26 10 PK STRL BLUE (TOWEL DISPOSABLE) ×3 IMPLANT

## 2017-11-13 NOTE — H&P (Signed)
Willie Moore is an 37 y.o. male.   Chief Complaint: Pilonidal abscess HPI: This is a 37 yo male who presents with a large abscess over the sacral region that has been present for several days.  He previously had an attempt at drainage at [facility] complicated by bleeding in 6/19.  This abscess began forming several days ago and has become quite large.  He went to Urgent Care for evaluation.  They attempted I&D but he was unable to tolerate due to pain.  He was transferred to the ED.  CT scan shows a large abscess.  He is slightly tachycardic.  Past Medical History:  Diagnosis Date  . Allergy     Past Surgical History:  Procedure Laterality Date  . FRACTURE SURGERY      Family History  Problem Relation Age of Onset  . Diabetes Mother    Social History:  reports that he quit smoking about 2 years ago. He has never used smokeless tobacco. He reports that he drinks alcohol. He reports that he does not use drugs.  Allergies: No Known Allergies  Prior to Admission medications   Medication Sig Start Date End Date Taking? Authorizing Provider  sulfamethoxazole-trimethoprim (BACTRIM DS,SEPTRA DS) 800-160 MG tablet Take 1 tablet by mouth 2 (two) times daily. For ten days 08/19/17   [provider]     Results for orders placed or performed during the hospital encounter of 11/13/17 (from the past 48 hour(s))  CBC with Differential     Status: Abnormal   Collection Time: 11/13/17  5:15 PM  Result Value Ref Range   WBC 17.0 (H) 4.0 - 10.5 K/uL   RBC 5.08 4.22 - 5.81 MIL/uL   Hemoglobin 15.1 13.0 - 17.0 g/dL   HCT 45.6 39.0 - 52.0 %   MCV 89.8 78.0 - 100.0 fL   MCH 29.7 26.0 - 34.0 pg   MCHC 33.1 30.0 - 36.0 g/dL   RDW 12.2 11.5 - 15.5 %   Platelets 169 150 - 400 K/uL   Neutrophils Relative % 76 %   Neutro Abs 12.9 (H) 1.7 - 7.7 K/uL   Lymphocytes Relative 17 %   Lymphs Abs 2.8 0.7 - 4.0 K/uL   Monocytes Relative 6 %   Monocytes Absolute 1.0 0.1 - 1.0 K/uL   Eosinophils  Relative 1 %   Eosinophils Absolute 0.2 0.0 - 0.7 K/uL   Basophils Relative 0 %   Basophils Absolute 0.1 0.0 - 0.1 K/uL   Immature Granulocytes 0 %   Abs Immature Granulocytes 0.1 0.0 - 0.1 K/uL    Comment: Performed at [facility], [address], [place], [place] [address]  Comprehensive metabolic panel     Status: Abnormal   Collection Time: 11/13/17  5:15 PM  Result Value Ref Range   Sodium 135 135 - 145 mmol/L   Potassium 3.8 3.5 - 5.1 mmol/L   Chloride 97 (L) 98 - 111 mmol/L   CO2 26 22 - 32 mmol/L   Glucose, Bld 102 (H) 70 - 99 mg/dL   BUN 10 6 - 20 mg/dL   Creatinine, Ser 1.10 0.61 - 1.24 mg/dL   Calcium 9.1 8.9 - 10.3 mg/dL   Total Protein 7.9 6.5 - 8.1 g/dL   Albumin 4.0 3.5 - 5.0 g/dL   AST 78 (H) 15 - 41 U/L   ALT 76 (H) 0 - 44 U/L   Alkaline Phosphatase 91 38 - 126 U/L   Total Bilirubin 1.1 0.3 -  1.2 mg/dL   GFR calc non Af Amer >60 >60 mL/min   GFR calc Af Amer >60 >60 mL/min    Comment: (NOTE) The eGFR has been calculated using the CKD EPI equation. This calculation has not been validated in all clinical situations. eGFR's persistently <60 mL/min signify possible Chronic Kidney Disease.    Anion gap 12 5 - 15    Comment: Performed at [facility] [address], [place], [place] [address]  I-Stat CG4 Lactic Acid, ED     Status: None   Collection Time: 11/13/17  5:25 PM  Result Value Ref Range   Lactic Acid, Venous 1.57 0.5 - 1.9 mmol/L  Urinalysis, Routine w reflex microscopic     Status: Abnormal   Collection Time: 11/13/17  7:45 PM  Result Value Ref Range   Color, Urine YELLOW YELLOW   APPearance CLEAR CLEAR   Specific Gravity, Urine >1.046 (H) 1.005 - 1.030   pH 5.0 5.0 - 8.0   Glucose, UA NEGATIVE NEGATIVE mg/dL   Hgb urine dipstick MODERATE (A) NEGATIVE   Bilirubin Urine NEGATIVE NEGATIVE   Ketones, ur 80 (A) NEGATIVE mg/dL   Protein, ur NEGATIVE NEGATIVE mg/dL   Nitrite NEGATIVE NEGATIVE   Leukocytes, UA NEGATIVE NEGATIVE   RBC /  HPF 11-20 0 - 5 RBC/hpf   WBC, UA 0-5 0 - 5 WBC/hpf   Bacteria, UA RARE (A) NONE SEEN   Mucus PRESENT     Comment: Performed at [facility], [address], [place], [place] [address]   Ct Abdomen Pelvis W Contrast  Result Date: 11/13/2017 CLINICAL DATA:  Large abscess on lower back since Thursday. Also having nausea, vomiting, and fevers. History of similar abscess in the past. *Pt scanned laying prone, due to location of abscess, pt stated it was too painful to lie on his back. EXAM: CT ABDOMEN AND PELVIS WITH CONTRAST TECHNIQUE: Multidetector CT imaging of the abdomen and pelvis was performed using the standard protocol following bolus administration of intravenous contrast. CONTRAST:  135m ISOVUE-300 IOPAMIDOL (ISOVUE-300) INJECTION 61% COMPARISON:  08/21/2017 FINDINGS: Lower chest: No acute abnormality. Hepatobiliary: No focal liver abnormality is seen. No gallstones, gallbladder wall thickening, or biliary dilatation. Pancreas: Unremarkable. No pancreatic ductal dilatation or surrounding inflammatory changes. Spleen: Normal in size without focal abnormality. Adrenals/Urinary Tract: Adrenal glands are unremarkable. Kidneys are normal, without renal calculi, focal lesion, or hydronephrosis. Bladder is unremarkable. Stomach/Bowel: Stomach is within normal limits. Appendix appears normal. No evidence of bowel wall thickening, distention, or inflammatory changes. Vascular/Lymphatic: No significant vascular findings are present. No enlarged abdominal or pelvic lymph nodes. Reproductive: Prostate is unremarkable. Other: No ascites.  No free air. Musculoskeletal: Infectious/inflammatory process in the deep subcutaneous tissues Overlying the distal sacrum extending to the gluteal cleft, centered just to the right of midline, measuring 5.6 cm transverse x 8.4 craniocaudal x 3.2 cm anterior-posterior . Centrally the process contains loculated appearing fluid attenuation and scattered tiny gas bubbles.  There are inflammatory/edematous changes in the adjacent subcutaneous fat, and some fluid extending through the subcutaneous tissue planes of the posterior body wall up to the L1 level. The process approaches the distal sacrum and coccyx without any definite osseous demineralization to suggest osteomyelitis. Negative for fracture or worrisome bone lesion. IMPRESSION: 1. 8.4 cm loculated abscess in the posterior body wall overlying the distal sacrum, without CT evidence of osteomyelitis. 2. No acute intra-abdominal process. Electronically Signed   By: DLucrezia EuropeM.D.   On: 11/13/2017 19:36  Review of Systems  Constitutional: Negative for weight loss.  HENT: Negative for ear discharge, ear pain, hearing loss and tinnitus.   Eyes: Negative for blurred vision, double vision, photophobia and pain.  Respiratory: Negative for cough, sputum production and shortness of breath.   Cardiovascular: Negative for chest pain.  Gastrointestinal: Negative for abdominal pain, nausea and vomiting.  Genitourinary: Negative for dysuria, flank pain, frequency and urgency.  Musculoskeletal: Positive for back pain (sacral). Negative for falls, joint pain, myalgias and neck pain.  Neurological: Negative for dizziness, tingling, sensory change, focal weakness, loss of consciousness and headaches.  Endo/Heme/Allergies: Does not bruise/bleed easily.  Psychiatric/Behavioral: Negative for depression, memory loss and substance abuse. The patient is not nervous/anxious.     Blood pressure (!) 111/59, pulse (!) 103, temperature 98.5 F (36.9 C), temperature source Oral, resp. rate 18, SpO2 95 %. Physical Exam  WDWN in NAD Eyes:  Pupils equal, round; sclera anicteric HENT:  Oral mucosa moist; good dentition  Neck:  No masses palpated, no thyromegaly Lungs:  CTA bilaterally; normal respiratory effort CV:  Regular rate and rhythm; no murmurs; extremities well-perfused with no edema Abd:  +bowel sounds, soft, non-tender, no  palpable organomegaly; no palpable hernias Gluteal cleft - 9 cm area of erythema with significant induration.  1 cm incision - not bleeding; no drainage; exquisitely tender to palpation Skin:  Warm, dry; no sign of jaundice Psychiatric - alert and oriented x 4; calm mood and affect  Assessment/Plan Large pilonidal abscess 5.6 x 8.4 x 3.2 cm Elevated WBC and tachycardia  To OR tonight for incision and drainage IV antibiotics  Maia Petties, MD 11/13/2017, 9:09 PM

## 2017-11-13 NOTE — Anesthesia Procedure Notes (Signed)
Procedure Name: Intubation Date/Time: 11/13/2017 10:45 PM Performed by: Catalina Gravel, MD Pre-anesthesia Checklist: Patient identified, Emergency Drugs available, Suction available and Patient being monitored Patient Re-evaluated:Patient Re-evaluated prior to induction Oxygen Delivery Method: Circle system utilized Preoxygenation: Pre-oxygenation with 100% oxygen Induction Type: IV induction and Rapid sequence Ventilation: Mask ventilation without difficulty Laryngoscope Size: Mac and 4 Grade View: Grade II Tube type: Oral Tube size: 7.5 mm Number of attempts: 1 Airway Equipment and Method: Stylet Placement Confirmation: ETT inserted through vocal cords under direct vision,  positive ETCO2 and breath sounds checked- equal and bilateral Secured at: 24 cm Tube secured with: Tape Dental Injury: Teeth and Oropharynx as per pre-operative assessment

## 2017-11-13 NOTE — Transfer of Care (Signed)
Immediate Anesthesia Transfer of Care Note  Patient: Willie Moore  Procedure(s) Performed: INCISION AND DRAINAGE ABSCESS (N/A Buttocks)  Patient Location: PACU  Anesthesia Type:General  Level of Consciousness: drowsy and patient cooperative  Airway & Oxygen Therapy: Patient Spontanous Breathing and Patient connected to nasal cannula oxygen  Post-op Assessment: Report given to RN and Post -op Vital signs reviewed and stable  Post vital signs: Reviewed and stable  Last Vitals:  Vitals Value Taken Time  BP    Temp    Pulse    Resp    SpO2      Last Pain:  Vitals:   11/13/17 1951  TempSrc:   PainSc: 0-No pain         Complications: No apparent anesthesia complications

## 2017-11-13 NOTE — Op Note (Signed)
Preop diagnosis: Large pilonidal abscess Postop diagnosis: Same Procedure performed: Incision and drainage of pilonidal abscess (skin and subcutaneous tissue 4 x 5 cm) Surgeon:Jaree Trinka K Paxon Propes Anesthesia: General endotracheal Indications: This is a 37 year old male who is status post attempted incision and drainage of a small abscess in the pilonidal region in June of this year.  This abscess began to recur earlier this week.  He has become very large and tender.  There were attempts made today at urgent care to drain this under local anesthetic but those were unsuccessful.  He presented to the emergency department for evaluation.  He was tachycardic and had a high white count.  CT scan showed a 5.6 x 8.4 x 3.2 cm abscess.  Surgery was consulted and we recommended immediate surgery.  Description of procedure: The patient is brought to the operating room and placed in the supine position on a stretcher.  After an adequate level of general anesthesia was obtained, he was moved to a prone position on chest rolls.  The area over the abscess and down around the pilonidal region over the coccyx were shaved, prepped with Betadine and draped in sterile fashion.  A timeout was taken to ensure the proper patient and proper procedure.  There is no drainage down near the coccyx but there is a prominent skin pit that seems mildly irritated.  The area of the most fluctuance is over the sacrum.  We opened the previous incision and encountered a large amount of foul-smelling purulent fluid.  This was cultured and sent for microbiology.  I made a round incision over this area.  We evacuated all of the purulent fluid from the abscess cavity.  I excised the overlying skin and subcutaneous tissue.  This left a wound measuring 4 x 5 cm.  Scalpel and cautery were used for the debridement.  The walls of the abscess cavity are quite inflamed.  Cautery was used for hemostasis.  I then explored the wound.  Inferiorly in the wound there  is a long tunnel tracking about 5 cm under the subcutaneous tissue to the pilonidal region.  This corresponded to the irritated opening near the coccyx.  I excised the skin around this opening to expose the tunnel.  A Penrose drain was passed through this tunnel.  This was secured with 2-0 nylon sutures.  We irrigated the wound thoroughly.  Hemostasis was obtained with cautery.  The wound was packed with 4 x 4 gauze moistened with saline.  Dry dressings were applied.  The patient was then moved back to a supine position.  He was extubated and brought to the recovery room in stable condition.  All sponge, instrument, and needle counts are correct.  Wilmon ArmsMatthew K. Corliss Skainssuei, MD, [facility]  General/ Trauma Surgery Beeper [phone]  11/13/2017 11:26 PM

## 2017-11-13 NOTE — Anesthesia Preprocedure Evaluation (Addendum)
Anesthesia Evaluation  Patient identified by MRN, date of birth, ID band Patient awake    Reviewed: Allergy & Precautions, NPO status , Patient's Chart, lab work & pertinent test results  Airway Mallampati: II  TM Distance: >3 FB Neck ROM: Full    Dental  (+) Teeth Intact, Dental Advisory Given   Pulmonary Current Smoker,    Pulmonary exam normal breath sounds clear to auscultation       Cardiovascular Exercise Tolerance: Good negative cardio ROS Normal cardiovascular exam Rhythm:Regular Rate:Normal     Neuro/Psych negative neurological ROS  negative psych ROS   GI/Hepatic negative GI ROS, Neg liver ROS,   Endo/Other  negative endocrine ROS  Renal/GU negative Renal ROS     Musculoskeletal negative musculoskeletal ROS (+) PILONIDAL ABSCESS   Abdominal   Peds  Hematology negative hematology ROS (+)   Anesthesia Other Findings Day of surgery medications reviewed with the patient.  Reproductive/Obstetrics                            Anesthesia Physical Anesthesia Plan  ASA: II and emergent  Anesthesia Plan: General   Post-op Pain Management:    Induction: Intravenous  PONV Risk Score and Plan: 3 and Midazolam, Dexamethasone and Ondansetron  Airway Management Planned: Oral ETT  Additional Equipment:   Intra-op Plan:   Post-operative Plan: Extubation in OR  Informed Consent: I have reviewed the patients History and Physical, chart, labs and discussed the procedure including the risks, benefits and alternatives for the proposed anesthesia with the patient or authorized representative who has indicated his/her understanding and acceptance.   Dental advisory given  Plan Discussed with: CRNA  Anesthesia Plan Comments:        Anesthesia Quick Evaluation

## 2017-11-13 NOTE — ED Provider Notes (Signed)
[facility] EMERGENCY DEPARTMENT Provider Note   CSN: [id number] Arrival date & time: 11/13/17  1513     History   Chief Complaint Chief Complaint  Patient presents with  . Abscess    HPI Willie Moore is a 37 y.o. male.  The history is provided by the patient and medical records. No language interpreter was used.  Abscess  Location:  Pelvis Pelvic abscess location:  Gluteal cleft Size:  6cm Abscess quality: fluctuance, induration, painful, redness and warmth   Abscess quality: not draining   Duration:  3 days Progression:  Unchanged Pain details:    Quality:  Dull   Severity:  Severe   Duration:  3 days   Timing:  Constant   Progression:  Worsening Chronicity:  Recurrent Relieved by:  Nothing Worsened by:  Nothing Ineffective treatments:  None tried Associated symptoms: fatigue, fever and nausea   Associated symptoms: no vomiting     Past Medical History:  Diagnosis Date  . Allergy     Patient Active Problem List   Diagnosis Date Noted  . Smoker 02/24/2015    Past Surgical History:  Procedure Laterality Date  . FRACTURE SURGERY          Home Medications    Prior to Admission medications   Medication Sig Start Date End Date Taking? Authorizing Provider  sulfamethoxazole-trimethoprim (BACTRIM DS,SEPTRA DS) 800-160 MG tablet Take 1 tablet by mouth 2 (two) times daily. For ten days 08/19/17   [provider]    Family History Family History  Problem Relation Age of Onset  . Diabetes Mother     Social History Social History   Tobacco Use  . Smoking status: Former Smoker    Last attempt to quit: 05/12/2015    Years since quitting: 2.5  . Smokeless tobacco: Never Used  Substance Use Topics  . Alcohol use: Yes    Alcohol/week: 0.0 standard drinks  . Drug use: No     Allergies   Patient has no known allergies.   Review of Systems Review of Systems  Constitutional: Positive for chills, fatigue and fever.  Negative for diaphoresis.  HENT: Negative for congestion.   Eyes: Negative for visual disturbance.  Respiratory: Negative for cough, chest tightness, shortness of breath and wheezing.   Cardiovascular: Negative for chest pain, palpitations and leg swelling.  Gastrointestinal: Positive for nausea. Negative for abdominal pain, constipation, diarrhea and vomiting.  Genitourinary: Negative for dysuria and flank pain.  Musculoskeletal: Positive for back pain. Negative for neck pain and neck stiffness.  Skin: Positive for rash and wound (incision).  Psychiatric/Behavioral: Negative for agitation.     Physical Exam Updated Vital Signs BP 138/73 (BP Location: Right Arm)   Pulse 96   Temp 98.5 F (36.9 C) (Oral)   Resp 18   SpO2 100%   Physical Exam  Constitutional: He is oriented to person, place, and time. He appears well-developed and well-nourished. No distress.  HENT:  Head: Normocephalic and atraumatic.  Mouth/Throat: Oropharynx is clear and moist. No oropharyngeal exudate.  Eyes: Pupils are equal, round, and reactive to light. Conjunctivae are normal.  Neck: Neck supple.  Cardiovascular: Regular rhythm.  No murmur heard. Pulmonary/Chest: Effort normal and breath sounds normal. No respiratory distress. He has no wheezes. He has no rales. He exhibits no tenderness.  Abdominal: Soft. There is no tenderness. There is no guarding.  Musculoskeletal: He exhibits tenderness. He exhibits no edema.       Lumbar back: He exhibits  tenderness and pain.       Back:  Neurological: He is alert and oriented to person, place, and time. He displays normal reflexes. He exhibits normal muscle tone.  Skin: Skin is warm and dry. Capillary refill takes less than 2 seconds. He is not diaphoretic. There is erythema.  Psychiatric: He has a normal mood and affect.  Nursing note and vitals reviewed.    ED Treatments / Results  Labs (all labs ordered are listed, but only abnormal results are  displayed) Labs Reviewed  CBC WITH DIFFERENTIAL/PLATELET - Abnormal; Notable for the following components:      Result Value   WBC 17.0 (*)    Neutro Abs 12.9 (*)    All other components within normal limits  COMPREHENSIVE METABOLIC PANEL - Abnormal; Notable for the following components:   Chloride 97 (*)    Glucose, Bld 102 (*)    AST 78 (*)    ALT 76 (*)    All other components within normal limits  URINALYSIS, ROUTINE W REFLEX MICROSCOPIC - Abnormal; Notable for the following components:   Specific Gravity, Urine >1.046 (*)    Hgb urine dipstick MODERATE (*)    Ketones, ur 80 (*)    Bacteria, UA RARE (*)    All other components within normal limits  CULTURE, BLOOD (ROUTINE X 2)  CULTURE, BLOOD (ROUTINE X 2)  URINE CULTURE  ANAEROBIC CULTURE  ANAEROBIC CULTURE  GRAM STAIN  GRAM STAIN  AEROBIC/ANAEROBIC CULTURE (SURGICAL/DEEP WOUND)  AEROBIC CULTURE (SUPERFICIAL SPECIMEN)  I-STAT CG4 LACTIC ACID, ED  SURGICAL PATHOLOGY    EKG None  Radiology Ct Abdomen Pelvis W Contrast  Result Date: 11/13/2017 CLINICAL DATA:  Large abscess on lower back since Thursday. Also having nausea, vomiting, and fevers. History of similar abscess in the past. *Pt scanned laying prone, due to location of abscess, pt stated it was too painful to lie on his back. EXAM: CT ABDOMEN AND PELVIS WITH CONTRAST TECHNIQUE: Multidetector CT imaging of the abdomen and pelvis was performed using the standard protocol following bolus administration of intravenous contrast. CONTRAST:  100mL ISOVUE-300 IOPAMIDOL (ISOVUE-300) INJECTION 61% COMPARISON:  08/21/2017 FINDINGS: Lower chest: No acute abnormality. Hepatobiliary: No focal liver abnormality is seen. No gallstones, gallbladder wall thickening, or biliary dilatation. Pancreas: Unremarkable. No pancreatic ductal dilatation or surrounding inflammatory changes. Spleen: Normal in size without focal abnormality. Adrenals/Urinary Tract: Adrenal glands are unremarkable.  Kidneys are normal, without renal calculi, focal lesion, or hydronephrosis. Bladder is unremarkable. Stomach/Bowel: Stomach is within normal limits. Appendix appears normal. No evidence of bowel wall thickening, distention, or inflammatory changes. Vascular/Lymphatic: No significant vascular findings are present. No enlarged abdominal or pelvic lymph nodes. Reproductive: Prostate is unremarkable. Other: No ascites.  No free air. Musculoskeletal: Infectious/inflammatory process in the deep subcutaneous tissues Overlying the distal sacrum extending to the gluteal cleft, centered just to the right of midline, measuring 5.6 cm transverse x 8.4 craniocaudal x 3.2 cm anterior-posterior . Centrally the process contains loculated appearing fluid attenuation and scattered tiny gas bubbles. There are inflammatory/edematous changes in the adjacent subcutaneous fat, and some fluid extending through the subcutaneous tissue planes of the posterior body wall up to the L1 level. The process approaches the distal sacrum and coccyx without any definite osseous demineralization to suggest osteomyelitis. Negative for fracture or worrisome bone lesion. IMPRESSION: 1. 8.4 cm loculated abscess in the posterior body wall overlying the distal sacrum, without CT evidence of osteomyelitis. 2. No acute intra-abdominal process. Electronically Signed   By:  Corlis Leak M.D.   On: 11/13/2017 19:36    Procedures Procedures (including critical care time)   Medications Ordered in ED Medications  fentaNYL (SUBLIMAZE) injection 25-50 mcg (has no administration in time range)  promethazine (PHENERGAN) injection 6.25-12.5 mg (has no administration in time range)  ketorolac (TORADOL) 30 MG/ML injection 30 mg (has no administration in time range)    Followed by  ketorolac (TORADOL) 30 MG/ML injection 30 mg (has no administration in time range)  sodium chloride 0.9 % bolus 1,000 mL (0 mLs Intravenous Stopped 11/13/17 1904)  ondansetron (ZOFRAN)  injection 4 mg (4 mg Intravenous Given 11/13/17 1725)  HYDROmorphone (DILAUDID) injection 1 mg (1 mg Intravenous Given 11/13/17 1725)  iopamidol (ISOVUE-300) 61 % injection (100 mLs  Contrast Given 11/13/17 1830)  ondansetron (ZOFRAN) injection 4 mg (4 mg Intravenous Given 11/13/17 1913)  clindamycin (CLEOCIN) IVPB 600 mg (0 mg Intravenous Stopped 11/13/17 2041)  sodium chloride 0.9 % bolus 1,000 mL (1,000 mLs Intravenous New Bag/Given 11/13/17 2005)  HYDROmorphone (DILAUDID) injection 1 mg (1 mg Intravenous Given 11/13/17 2120)  ketorolac (TORADOL) 30 MG/ML injection (30 mg  Given 11/14/17 0007)     Initial Impression / Assessment and Plan / ED Course  I have reviewed the triage vital signs and the nursing notes.  Pertinent labs & imaging results that were available during my care of the patient were reviewed by me and considered in my medical decision making (see chart for details).     Willie Moore is a 37 y.o. male with a past medical history significant for prior pilonidal abscess who presents from an urgent care for recurrent abscess.  Patient reports that several months ago he had an abscess that was drained.  He reports that he had bleeding comp occasions afterwards but has been doing well.  He says that over the last few days he has had melena fevers, chills, nausea, and worsening low back pain.  He denies loss of bowel or bladder function.  He denies any trauma.  He denies any numbness tingling or weakness of his legs.  He does report that he has had severe pain that is up to 10 out of 10 going from his low back upwards.  He says that his temperature was 102 yesterday.  He says he has had nausea and vomiting with dry heaving.  He says his pain is currently a 6 out of 10 in the low back when not moving or being palpated.  He denies any conservation, diarrhea, or other urinary symptoms.  He denies any other complaints.  Patient says that he went to urgent care where they attempted a drainage.   The physician that saw him reportedly continued to attend drainage but could not find any purulence.  Due to patient's fever, the erythema, and the worsening pain without successful drainage, patient was sent to the ED for evaluation.  On my exam, patient does have a 6 cm area of induration and erythema around his low back just above the gluteal cleft.  There is exquisite tenderness.  No purulence was seen.  A incision wound is seen without any drainage.  Patient has exquisite tenderness dislocation as well as tenderness moving up his back.  Legs had normal strength sensation and pulses.  Patient was tachycardic on initial evaluation with a rate in the 120s.    Clinical I am concerned patient may have a worsening sialitis or abscess.  Patient will have CT scan to determine how deep infection may  go.  Anticipate we may need to speak with surgery for further management depending on what the imaging reveals.  Patient was given pain medicine, nausea medicine and fluids.  7:48 PM ET scan returned showing large loculated abscess with gas bubbles present.  It also goes deep and touches the coccyx and sacrum.  There is no mineralization abnormality and there is no findings of osteomyelitis on the CT.  Patient's found to have a leukocytosis and he continues to have tachycardia.  Given the size and extent with the abscess going deep, general surgery will be called.  Patient will be started on antibiotics.   Final Clinical Impressions(s) / ED Diagnoses   Final diagnoses:  Abscess  Cellulitis of buttock    ED Discharge Orders    None     Clinical Impression: 1. Abscess   2. Cellulitis of buttock     Disposition: Admit  This note was prepared with assistance of Dragon voice recognition software. Occasional wrong-word or sound-a-like substitutions may have occurred due to the inherent limitations of voice recognition software.     Tegeler, Canary Brim, MD 11/14/17 [phone]

## 2017-11-13 NOTE — ED Triage Notes (Addendum)
Abscessed on tailbone and pushing on spine, fevers, vomiting, [facility]

## 2017-11-13 NOTE — Discharge Instructions (Addendum)
Meds ordered this encounter  Medications   ondansetron (ZOFRAN-ODT) disintegrating tablet 4 mg   DISCONTD: morphine 2 MG/ML injection 4 mg   morphine 4 MG/ML injection 4 mg

## 2017-11-13 NOTE — ED Triage Notes (Signed)
[facility], has a pilondial abscess, hx of same, urgent care attempted an I&D, without success. Sent here for further evaluation.

## 2017-11-13 NOTE — ED Notes (Signed)
Pt in CT.

## 2017-11-13 NOTE — ED Provider Notes (Signed)
[facility]   [id number] 11/13/17 Arrival Time: 1149  ASSESSMENT & PLAN:  1. Pilonidal abscess   2. Nausea    Meds ordered this encounter  Medications  . ondansetron (ZOFRAN-ODT) disintegrating tablet 4 mg  . morphine 4 MG/ML injection 4 mg   Incision and Drainage Procedure Note  Anesthesia: 2% plain lidocaine without epinephrine. Approx 8-10cc injected superficially and around indurated area.  Procedure Details  The procedure, risks and complications have been discussed in detail (including, but not limited to pain and bleeding) with the patient.  The skin induration was prepped and draped in the usual fashion. After adequate local anesthesia, I&D with a #11 blade was performed just above the superior gluteal cleft. Purulent drainage: absent. Significant bleeding. He is unable to tolerate exploration of area secondary to pain.  EBL: Minimal.  Drains: None.  Condition: Procedure stopped secondary to reported pain. Elects ED (+/- surgical) evaluation.  Complications: Pain. Moderate bleeding that is controlled with pressure.  Wound bandaged by nurse. Wheeled to ED for further evaluation.  Patient verbalized understanding. After Visit Summary given.   SUBJECTIVE:  Willie Moore is a 37 y.o. male who presents with a possible abscess of his superior midline buttock. Onset abrupt over the past 2 days. No drainage. H/O similar a couple of months ago that required "a very deep incision". Afebrile. Ambulatory. Trouble sitting secondary to pain. No OTC treatment.  ROS: As per HPI.  OBJECTIVE:  Vitals:   11/13/17 1252  BP: 130/86  Pulse: (!) 101  Temp: 98.6 F (37 C)  TempSrc: Oral  SpO2: 100%    General appearance: alert; appears to be in pain Skin: 3x4 cm induration just above superior gluteal cleft; very tender to touch; no active drainage; overlying skin erythema Psychological: alert and cooperative; normal mood and affect  No Known Allergies  Past  Medical History:  Diagnosis Date  . Allergy    Social History   Socioeconomic History  . Marital status: Single    Spouse name: Not on file  . Number of children: Not on file  . Years of education: Not on file  . Highest education level: Not on file  Occupational History  . Not on file  Social Needs  . Financial resource strain: Not on file  . Food insecurity:    Worry: Not on file    Inability: Not on file  . Transportation needs:    Medical: Not on file    Non-medical: Not on file  Tobacco Use  . Smoking status: Former Smoker    Last attempt to quit: 05/12/2015    Years since quitting: 2.5  . Smokeless tobacco: Never Used  Substance and Sexual Activity  . Alcohol use: Yes    Alcohol/week: 0.0 standard drinks  . Drug use: No  . Sexual activity: Yes  Lifestyle  . Physical activity:    Days per week: Not on file    Minutes per session: Not on file  . Stress: Not on file  Relationships  . Social connections:    Talks on phone: Not on file    Gets together: Not on file    Attends religious service: Not on file    Active member of club or organization: Not on file    Attends meetings of clubs or organizations: Not on file    Relationship status: Not on file  Other Topics Concern  . Not on file  Social History Narrative  . Not on file   Family  History  Problem Relation Age of Onset  . Diabetes Mother    Past Surgical History:  Procedure Laterality Date  . FRACTURE SURGERY             Mardella LaymanHagler, Marylu Dudenhoeffer, MD 11/13/17 1501

## 2017-11-14 ENCOUNTER — Other Ambulatory Visit: Payer: Self-pay

## 2017-11-14 LAB — CBC
HEMATOCRIT: 38.3 % — AB (ref 39.0–52.0)
HEMOGLOBIN: 12.6 g/dL — AB (ref 13.0–17.0)
MCH: 29.4 pg (ref 26.0–34.0)
MCHC: 32.9 g/dL (ref 30.0–36.0)
MCV: 89.5 fL (ref 78.0–100.0)
Platelets: 155 10*3/uL (ref 150–400)
RBC: 4.28 MIL/uL (ref 4.22–5.81)
RDW: 12.3 % (ref 11.5–15.5)
WBC: 16.5 10*3/uL — ABNORMAL HIGH (ref 4.0–10.5)

## 2017-11-14 LAB — BASIC METABOLIC PANEL
ANION GAP: 6 (ref 5–15)
BUN: 9 mg/dL (ref 6–20)
CALCIUM: 8.3 mg/dL — AB (ref 8.9–10.3)
CO2: 24 mmol/L (ref 22–32)
Chloride: 105 mmol/L (ref 98–111)
Creatinine, Ser: 1.02 mg/dL (ref 0.61–1.24)
GFR calc Af Amer: >60 mL/min (ref >60)
GFR calc non Af Amer: >60 mL/min (ref >60)
GLUCOSE: 187 mg/dL — AB (ref 70–99)
Potassium: 4.6 mmol/L (ref 3.5–5.1)
Sodium: 135 mmol/L (ref 135–145)

## 2017-11-14 LAB — URINE CULTURE: CULTURE: NO GROWTH

## 2017-11-14 MED ORDER — TRAMADOL HCL 50 MG PO TABS: 50.0000 mg | ORAL_TABLET | Freq: Four times a day (QID) | ORAL | Status: DC | PRN

## 2017-11-14 MED ORDER — ROCURONIUM BROMIDE 50 MG/5ML IV SOSY
PREFILLED_SYRINGE | INTRAVENOUS | Status: AC
  Filled 2017-11-14: qty 5

## 2017-11-14 MED ORDER — AMOXICILLIN-POT CLAVULANATE 875-125 MG PO TABS: 1.0000 | ORAL_TABLET | Freq: Two times a day (BID) | ORAL | 0 refills | Status: DC

## 2017-11-14 MED ORDER — PIPERACILLIN-TAZOBACTAM 3.375 G IVPB
3.3750 g | Freq: Three times a day (TID) | INTRAVENOUS | Status: DC
  Administered 2017-11-14 – 2017-11-15 (×5): 3.375 g via INTRAVENOUS
  Filled 2017-11-14 (×5): qty 50

## 2017-11-14 MED ORDER — TRAMADOL HCL 50 MG PO TABS: 50.0000 mg | ORAL_TABLET | Freq: Four times a day (QID) | ORAL | 0 refills | Status: DC | PRN

## 2017-11-14 MED ORDER — LIDOCAINE HCL (PF) 1 % IJ SOLN
INTRAMUSCULAR | Status: AC
  Administered 2017-11-14: 30 mL
  Filled 2017-11-14: qty 30

## 2017-11-14 MED ORDER — MORPHINE SULFATE (PF) 2 MG/ML IV SOLN
2.0000 mg | INTRAVENOUS | Status: DC | PRN
  Administered 2017-11-14: 4 mg via INTRAVENOUS
  Administered 2017-11-14: 2 mg via INTRAVENOUS
  Filled 2017-11-14 (×2): qty 2

## 2017-11-14 MED ORDER — OXYCODONE HCL 5 MG PO TABS: 5.0000 mg | ORAL_TABLET | ORAL | Status: DC | PRN

## 2017-11-14 MED ORDER — SODIUM CHLORIDE 0.9 % IV SOLN
INTRAVENOUS | Status: DC | PRN
  Administered 2017-11-14: 250 mL via INTRAVENOUS

## 2017-11-14 MED ORDER — KETOROLAC TROMETHAMINE 30 MG/ML IJ SOLN: 30.0000 mg | Freq: Four times a day (QID) | INTRAMUSCULAR | Status: DC | PRN

## 2017-11-14 MED ORDER — SUCCINYLCHOLINE CHLORIDE 200 MG/10ML IV SOSY
PREFILLED_SYRINGE | INTRAVENOUS | Status: AC
  Filled 2017-11-14: qty 10

## 2017-11-14 MED ORDER — DIPHENHYDRAMINE HCL 50 MG/ML IJ SOLN: 25.0000 mg | Freq: Four times a day (QID) | INTRAMUSCULAR | Status: DC | PRN

## 2017-11-14 MED ORDER — ONDANSETRON HCL 4 MG/2ML IJ SOLN
INTRAMUSCULAR | Status: AC
  Filled 2017-11-14: qty 2

## 2017-11-14 MED ORDER — LIDOCAINE 2% (20 MG/ML) 5 ML SYRINGE
INTRAMUSCULAR | Status: AC
  Filled 2017-11-14: qty 5

## 2017-11-14 MED ORDER — ONDANSETRON HCL 4 MG/2ML IJ SOLN: 4.0000 mg | Freq: Four times a day (QID) | INTRAMUSCULAR | Status: DC | PRN

## 2017-11-14 MED ORDER — DIPHENHYDRAMINE HCL 25 MG PO CAPS: 25.0000 mg | ORAL_CAPSULE | Freq: Four times a day (QID) | ORAL | Status: DC | PRN

## 2017-11-14 MED ORDER — AMOXICILLIN-POT CLAVULANATE 875-125 MG PO TABS: 1.0000 | ORAL_TABLET | Freq: Two times a day (BID) | ORAL | 0 refills | Status: AC

## 2017-11-14 MED ORDER — CEFAZOLIN SODIUM 1 G IJ SOLR
INTRAMUSCULAR | Status: AC
  Filled 2017-11-14: qty 10

## 2017-11-14 MED ORDER — ONDANSETRON 4 MG PO TBDP: 4.0000 mg | ORAL_TABLET | Freq: Four times a day (QID) | ORAL | Status: DC | PRN

## 2017-11-14 MED ORDER — KETOROLAC TROMETHAMINE 30 MG/ML IJ SOLN
INTRAMUSCULAR | Status: AC
  Administered 2017-11-14: 30 mg
  Filled 2017-11-14: qty 1

## 2017-11-14 MED ORDER — SODIUM CHLORIDE 0.9 % IV SOLN
INTRAVENOUS | Status: DC
  Administered 2017-11-14 – 2017-11-15 (×4): via INTRAVENOUS

## 2017-11-14 MED ORDER — ENOXAPARIN SODIUM 40 MG/0.4ML ~~LOC~~ SOLN: 40.0000 mg | SUBCUTANEOUS | Status: DC

## 2017-11-14 MED ORDER — KETOROLAC TROMETHAMINE 30 MG/ML IJ SOLN: 30.0000 mg | Freq: Four times a day (QID) | INTRAMUSCULAR | Status: DC

## 2017-11-14 MED ORDER — ACETAMINOPHEN 500 MG PO TABS
1000.0000 mg | ORAL_TABLET | Freq: Four times a day (QID) | ORAL | Status: DC
  Administered 2017-11-14 – 2017-11-15 (×9): 1000 mg via ORAL
  Filled 2017-11-14 (×10): qty 2

## 2017-11-14 MED ORDER — TRAMADOL HCL 50 MG PO TABS: 50.0000 mg | ORAL_TABLET | Freq: Four times a day (QID) | ORAL | 0 refills | Status: AC | PRN

## 2017-11-14 MED ORDER — DEXAMETHASONE SODIUM PHOSPHATE 10 MG/ML IJ SOLN
INTRAMUSCULAR | Status: AC
  Filled 2017-11-14: qty 1

## 2017-11-14 NOTE — Progress Notes (Signed)
Patient ID: Willie Moore, male   DOB: 1980-09-09, 37 y.o.   MRN: [id number] During dressing changed developed bleeding. Had two skin sites that were bleeding. I placed a couple vicryl sutures and then packed friable wound bed with surgicel.  md to change dressing tomorrow and will not be discharged today

## 2017-11-14 NOTE — Progress Notes (Signed)
Received patient from PACU accompanied by wife.  Patient AOx4, ambulatory, VSS, O2Sat at 97% on 2L/min via [place] and pain at 2/10, oriented to room, bed controls and call light.  CNA soda & ice water to drink and chicken noodle soup, crackers and fruit cup to eat.  Discussed plan of care with patient and wife, will monitor.

## 2017-11-14 NOTE — Discharge Summary (Signed)
Patient ID: GATES DEMOULIN MRN: [id number] DOB/AGE: 1981/02/06 37 y.o.  Admit date: 11/13/2017 Discharge date: 11/14/2017  Discharge Diagnoses Patient Active Problem List   Diagnosis Date Noted  . Pilonidal abscess 11/13/2017  . Smoker 02/24/2015    Consultants None  Procedures OR 11/13/17 for I&D of pilonidal abscess with placement of penrose drain  Hospital Course: He went to the OR as above and was admitted postoepratively for monitoring. He did well. The morning following surgery, his pain was substantially improved and he felt well. He denied complaints.  NAD, comfortable RRR Skin: pilonidal wound with small amount of surrounding blanching erythema; no purulent drainage; wound bed healthy; penrose in place  He and his wife were taught and comfortable with wet to dry wound care  He was deemed stable for discharge home with course of PO abx given small amount of remaining cellulitis    Allergies as of 11/14/2017   No Known Allergies     Medication List    TAKE these medications   ALEVE-D SINUS & COLD 120-220 MG Tb12 Generic drug:  Pseudoephedrine-Naproxen Na Take 1 tablet by mouth daily as needed (sinus pressure).   amoxicillin-clavulanate 875-125 MG tablet Commonly known as:  AUGMENTIN Take 1 tablet by mouth 2 (two) times daily for 7 days.   multivitamin with minerals Tabs tablet Take 1 tablet by mouth daily.   traMADol 50 MG tablet Commonly known as:  ULTRAM Take 1 tablet (50 mg total) by mouth every 6 (six) hours as needed for up to 5 days (severe pain not contolled with tylenol and ibuprofen).          Stephanie Coup. Cliffton Asters, M.D. [facility]

## 2017-11-14 NOTE — Progress Notes (Signed)
Pt's dressing changed today, and started to bleed, applied pressure. Continued to bleed, MD notified -  MD advised applied sutures and packed wound.  Advised not to change dressing today, and MD will change dressing on 11/15/2017. MD notified later in the day, the site still bleeding. MD advised to apply pressure to site and continue to reinforce site. Advised will continue and continue to monitor.

## 2017-11-14 NOTE — Discharge Instructions (Signed)
POST OP INSTRUCTIONS  1. DIET: As tolerated.  2. Take your usually prescribed home medications unless otherwise directed.  3. PAIN CONTROL: a. Pain is best controlled by a usual combination of three different methods TOGETHER: i. Ice/Heat ii. Over the counter pain medication iii. Prescription pain medication b. Most patients will experience some swelling and bruising around the surgical site.  Ice packs or heating pads (30-60 minutes up to 6 times a day) will help. Some people prefer to use ice alone, heat alone, alternating between ice & heat.  Experiment to what works for you.  Swelling and bruising can take several weeks to resolve.   c. It is helpful to take an over-the-counter pain medication regularly for the first few weeks: i. Ibuprofen (Motrin/Advil) - 200mg  tabs - take 3 tabs (600mg ) every 6 hours as needed for pain ii. Acetaminophen (Tylenol) - you may take 650mg  every 6 hours as needed. You can take this with motrin as they act differently on the body. If you are taking a narcotic pain medication that has acetaminophen in it, do not take over the counter tylenol at the same time.  Iii. NOTE: You may take both of these medications together - most patients  find it most helpful when alternating between the two (i.e. Ibuprofen at 6am,  tylenol at 9am, ibuprofen at 12pm ...) d. A  prescription for pain medication should be given to you upon discharge.  Take your pain medication as prescribed if your pain is not adequatly controlled with the over-the-counter pain reliefs mentioned above.  4. Avoid getting constipated.  Between the surgery and the pain medications, it is common to experience some constipation.  Increasing fluid intake and taking a fiber supplement (such as Metamucil, Citrucel, FiberCon, MiraLax, etc) 1-2 times a day regularly will usually help prevent this problem from occurring.  A mild laxative (prune juice, Milk of Magnesia, MiraLax, etc) should be taken according to  package directions if there are no bowel movements after 48 hours.    5. Dressing: Change twice daily with wet-to-dry gauze application - you may use over the counter 4x4 gauze (available at the drug store) - moisten this with saline (also available at the drug store) and place in the wound bed. You may remove this when showering and then replace following - ok for soap/water to get in wound. The penrose drain will remain in place and we will assess it for removal at your follow-up appointment in 10-14 days. Avoid baths/pools/lakes/oceans until your wounds have fully healed.  6. ACTIVITIES as tolerated:   a. Avoid heavy lifting (>10lbs or 1 gallon of milk) for the next 6 weeks. b. You may resume regular (light) daily activities beginning the next day--such as daily self-care, walking, climbing stairs--gradually increasing activities as tolerated.  If you can walk 30 minutes without difficulty, it is safe to try more intense activity such as jogging, treadmill, bicycling, low-impact aerobics.  c. DO NOT PUSH THROUGH PAIN.  Let pain be your guide: If it hurts to do something, don't do it. d. Willie Moore may drive when you are no longer taking prescription pain medication, you can comfortably wear a seatbelt, and you can safely maneuver your car and apply brakes.  7. FOLLOW UP in our office a. Please call [facility] at [phone] to set up an appointment to see your surgeon in the office for a follow-up appointment approximately 2 weeks after your surgery. b. Make sure that you call for this appointment the day you  arrive home to insure a convenient appointment time.  9. If you have disability or family leave forms that need to be completed, you may have them completed by your primary care physician's office; for return to work instructions, please ask our office staff and they will be happy to assist you in obtaining this documentation   When to call us [phone]: 1. Poor pain control 2. Reactions /  problems with new medications (rash/itching, etc)  3. Fever over 101.5 F (38.5 C) 4. Inability to urinate 5. Nausea/vomiting 6. Worsening swelling or bruising 7. Continued bleeding from incision. 8. Increased pain, redness, or drainage from the incision  The clinic staff is available to answer your questions during regular business hours (8:30am-5pm).  Please dont hesitate to call and ask to speak to one of our nurses for clinical concerns.   A surgeon from [facility] is always on call at the hospitals   If you have a medical emergency, go to the nearest emergency room or call 911.  [facility] [address], [address], [place], [place]  [address] MAIN: [phone] FAX: [phone] [email or web address]

## 2017-11-15 MED ORDER — GUAIFENESIN-DM 100-10 MG/5ML PO SYRP
5.0000 mL | ORAL_SOLUTION | ORAL | Status: DC | PRN
  Administered 2017-11-15: 5 mL via ORAL
  Filled 2017-11-15: qty 5

## 2017-11-15 MED ORDER — PHENOL 1.4 % MT LIQD
1.0000 | OROMUCOSAL | Status: DC | PRN
  Administered 2017-11-15: 1 via OROMUCOSAL
  Filled 2017-11-15: qty 177

## 2017-11-15 MED ORDER — IBUPROFEN 600 MG PO TABS
600.0000 mg | ORAL_TABLET | Freq: Four times a day (QID) | ORAL | Status: DC | PRN
  Administered 2017-11-15: 600 mg via ORAL
  Filled 2017-11-15: qty 1

## 2017-11-15 MED ORDER — AMOXICILLIN-POT CLAVULANATE 875-125 MG PO TABS
1.0000 | ORAL_TABLET | Freq: Two times a day (BID) | ORAL | Status: DC
  Administered 2017-11-15 – 2017-11-16 (×3): 1 via ORAL
  Filled 2017-11-15 (×3): qty 1

## 2017-11-15 NOTE — Anesthesia Postprocedure Evaluation (Signed)
Anesthesia Post Note  Patient: Willie Moore  Procedure(s) Performed: INCISION AND DRAINAGE ABSCESS (N/A Buttocks)     Patient location during evaluation: PACU Anesthesia Type: General Level of consciousness: awake and alert Pain management: pain level controlled Vital Signs Assessment: post-procedure vital signs reviewed and stable Respiratory status: spontaneous breathing, nonlabored ventilation and respiratory function stable Cardiovascular status: blood pressure returned to baseline and stable Postop Assessment: no apparent nausea or vomiting Anesthetic complications: no    Last Vitals:  Vitals:   11/14/17 2033 11/15/17 0524  BP: 113/62 (!) 100/54  Pulse: (!) 57 (!) 58  Resp: 18 16  Temp: (!) 36.4 C 36.6 C  SpO2: 100% 100%    Last Pain:  Vitals:   11/15/17 0524  TempSrc: Oral  PainSc:                  Cecile Hearing

## 2017-11-15 NOTE — Plan of Care (Signed)
°  Problem: Education: °Goal: Knowledge of General Education information will improve °Description: Including pain rating scale, medication(s)/side effects and non-pharmacologic comfort measures °Outcome: Progressing °  °Problem: Health Behavior/Discharge Planning: °Goal: Ability to manage health-related needs will improve °Outcome: Progressing °  °Problem: Clinical Measurements: °Goal: Ability to maintain clinical measurements within normal limits will improve °Outcome: Progressing °Goal: Will remain free from infection °Outcome: Progressing °Goal: Cardiovascular complication will be avoided °Outcome: Progressing °  °Problem: Activity: °Goal: Risk for activity intolerance will decrease °Outcome: Progressing °  °Problem: Nutrition: °Goal: Adequate nutrition will be maintained °Outcome: Progressing °  °

## 2017-11-15 NOTE — Progress Notes (Addendum)
Subjective Discharge discontinued as he had some bleeding from wound yesterday. This was controlled with pressure, suture placement and surgicel in wound bed. Better since. Having jaw and throat soreness today.  Objective: Vital signs in last 24 hours: Temp:  [97.5 F (36.4 C)-97.8 F (36.6 C)] 97.8 F (36.6 C) (09/02 0524) Pulse Rate:  [54-58] 58 (09/02 0524) Resp:  [16-18] 16 (09/02 0524) BP: (100-119)/(54-66) 100/54 (09/02 0524) SpO2:  [99 %-100 %] 100 % (09/02 0524) Last BM Date: 11/13/17  Intake/Output from previous day: 09/01 0701 - 09/02 0700 In: 1495 [P.O.:720; I.V.:670.5; IV Piggyback:104.6] Out: -  Intake/Output this shift: No intake/output data recorded.  Gen: NAD, comfortable CV: RRR Pulm: Normal work of breathing Abd: Soft, NT/ND Skin: Pilonidal wound - 4x4s removed. No active bleeding. Surgicel in wound base left in place today. Ext: SCDs in place  Lab Results: CBC  Recent Labs    11/13/17 1715 11/14/17 0608  WBC 17.0* 16.5*  HGB 15.1 12.6*  HCT 45.6 38.3*  PLT 169 155   BMET Recent Labs    11/13/17 1715 11/14/17 0608  NA 135 135  K 3.8 4.6  CL 97* 105  CO2 26 24  GLUCOSE 102* 187*  BUN 10 9  CREATININE 1.10 1.02  CALCIUM 9.1 8.3*   PT/INR No results for input(s): LABPROT, INR in the last 72 hours. ABG No results for input(s): PHART, HCO3 in the last 72 hours.  Invalid input(s): PCO2, PO2  Studies/Results:  Anti-infectives: Anti-infectives (From admission, onward)   Start     Dose/Rate Route Frequency Ordered Stop   11/14/17 0100  piperacillin-tazobactam (ZOSYN) IVPB 3.375 g     3.375 g 12.5 mL/hr over 240 Minutes Intravenous Every 8 hours 11/14/17 0043     11/14/17 0000  amoxicillin-clavulanate (AUGMENTIN) 875-125 MG tablet  Status:  Discontinued     1 tablet Oral 2 times daily 11/14/17 0854 11/14/17    11/14/17 0000  amoxicillin-clavulanate (AUGMENTIN) 875-125 MG tablet     1 tablet Oral 2 times daily 11/14/17 0933 11/21/17 2359    11/13/17 2000  clindamycin (CLEOCIN) IVPB 600 mg     600 mg 100 mL/hr over 30 Minutes Intravenous  Once 11/13/17 1949 11/13/17 2041       Assessment/Plan: Patient Active Problem List   Diagnosis Date Noted  . Pilonidal abscess 11/13/2017  . Smoker 02/24/2015   s/p Procedure(s): INCISION AND DRAINAGE PILONIDAL ABSCESS 11/13/2017  -Continue PO abx today -Wet to dry to wound - change BID -Will leave surgicel in wound bed today and remove tomorrow -Ibuprofen for jaw and throat discomfort -If no further bleeding with additional dressing changes and feeling well, plan discharge home tomorrow. Doesn't feel comfortable going home today due to concerns about wound care and bleeding should it recur -PPx: SCDs. Will discontinue prophylactic lovenox given bleeding from friable wound   LOS: 0 days   Stephanie Coup. Cliffton Asters, M.D. [facility]

## 2017-11-16 ENCOUNTER — Encounter (HOSPITAL_COMMUNITY): Payer: Self-pay | Admitting: Surgery

## 2017-11-16 LAB — AEROBIC CULTURE W GRAM STAIN (SUPERFICIAL SPECIMEN)

## 2017-11-16 LAB — AEROBIC CULTURE  (SUPERFICIAL SPECIMEN)

## 2017-11-16 MED ORDER — IBUPROFEN 600 MG PO TABS: 600.0000 mg | ORAL_TABLET | Freq: Four times a day (QID) | ORAL | 0 refills | Status: AC | PRN

## 2017-11-16 NOTE — Plan of Care (Signed)

## 2017-11-16 NOTE — Discharge Summary (Signed)
[facility] Discharge Summary   Willie Moore ID: Willie Moore MRN: [id number] DOB/AGE: 09-16-1980 37 y.o.  Admit date: 11/13/2017 Discharge date: 11/16/2017  Admitting Diagnosis: Pilonidal abscess  Discharge Diagnosis Willie Moore Active Problem List   Diagnosis Date Noted  . Pilonidal abscess 11/13/2017  . Smoker 02/24/2015    Consultants None  Imaging: No results found.  Procedures Dr. Corliss Skains (11/13/17) - Incision and drainage  Hospital Course:  Willie Moore is a 37 year old male who presented with a large abscess over sacral region for several days.  Workup showed pilonidal abscess.  Willie Moore was admitted and underwent procedure listed above.  Tolerated procedure well and was transferred to the floor.  Diet was advanced as tolerated.  On POD#3, the Willie Moore was voiding well, tolerating diet, ambulating well, pain well controlled, vital signs stable, incisions c/d/i and felt stable for discharge home.  Willie Moore will follow up in our office in 2 weeks and knows to call with questions or concerns.  He will call to confirm appointment date/time.    Willie Moore states he and his wife should be able to handle wound care and declines home health at this time.   Physical Exam: General:  Alert, NAD, pleasant, comfortable Skin: wound as below, no bleeding with removal of surgicel, penrose drain in place      Allergies as of 11/16/2017   No Known Allergies     Medication List    TAKE these medications   ALEVE-D SINUS & COLD 120-220 MG Tb12 Generic drug:  Pseudoephedrine-Naproxen Na Take 1 tablet by mouth daily as needed (sinus pressure).   amoxicillin-clavulanate 875-125 MG tablet Commonly known as:  AUGMENTIN Take 1 tablet by mouth 2 (two) times daily for 7 days.   ibuprofen 600 MG tablet Commonly known as:  ADVIL,MOTRIN Take 1 tablet (600 mg total) by mouth every 6 (six) hours as needed for mild pain (pain).   multivitamin with minerals Tabs tablet Take 1 tablet by mouth  daily.   traMADol 50 MG tablet Commonly known as:  ULTRAM Take 1 tablet (50 mg total) by mouth every 6 (six) hours as needed for up to 5 days (severe pain not contolled with tylenol and ibuprofen.).        Follow-up Information    Manus Rudd, MD. Go on 11/29/2017.   Specialty:  General Surgery Why:  Appointment scheduled for 9:40 AM. Please arrive 30 min prior to appointment time for check in. Bring photo ID and insurance information.  Contact information: [address] [place] [address] [phone]           Signed: Wells Guiles, Dr [facility] 11/16/2017, 9:24 AM Pager: [phone] Consults: [phone] Mon-Fri 7:00 am-4:30 pm Sat-Sun 7:00 am-11:30 am

## 2017-11-16 NOTE — Progress Notes (Signed)
Discharged to home, wife and patient educated dressing change verbalize understanding.

## 2017-11-18 LAB — AEROBIC/ANAEROBIC CULTURE (SURGICAL/DEEP WOUND)

## 2017-11-18 LAB — CULTURE, BLOOD (ROUTINE X 2)
CULTURE: NO GROWTH
Culture: NO GROWTH
Special Requests: ADEQUATE
Special Requests: ADEQUATE

## 2017-11-18 LAB — AEROBIC/ANAEROBIC CULTURE W GRAM STAIN (SURGICAL/DEEP WOUND)

## 2017-11-19 LAB — ANAEROBIC CULTURE

## 2018-03-15 ENCOUNTER — Encounter (HOSPITAL_COMMUNITY): Payer: Self-pay | Admitting: *Deleted

## 2018-03-15 ENCOUNTER — Emergency Department (HOSPITAL_COMMUNITY)
Admission: EM | Admit: 2018-03-15 | Discharge: 2018-03-16 | Disposition: A | Discharge status: Home / Self Care | Payer: Commercial Managed Care - PPO | Attending: Emergency Medicine | Admitting: Emergency Medicine

## 2018-03-15 ENCOUNTER — Other Ambulatory Visit: Payer: Self-pay

## 2018-03-15 DIAGNOSIS — Z87891 Personal history of nicotine dependence: Secondary | ICD-10-CM | POA: Insufficient documentation

## 2018-03-15 DIAGNOSIS — L539 Erythematous condition, unspecified: Secondary | ICD-10-CM | POA: Diagnosis present

## 2018-03-15 DIAGNOSIS — Z79899 Other long term (current) drug therapy: Secondary | ICD-10-CM | POA: Diagnosis not present

## 2018-03-15 DIAGNOSIS — L244 Irritant contact dermatitis due to drugs in contact with skin: Secondary | ICD-10-CM | POA: Diagnosis not present

## 2018-03-15 NOTE — ED Triage Notes (Signed)
Pt had a tailbone abscess and had surgery in June here.  Pt states where the drain was thinks it is now infected.  Area swollen and red.

## 2018-03-16 NOTE — ED Provider Notes (Signed)
[facility] Provider Note  CSN: [id number] Arrival date & time: 03/15/18 1743  Chief Complaint(s) No chief complaint on file.  HPI Willie Moore is a 38 y.o. male with a history of pilonidal cyst status post incision and drainage performed in June who presents to the emergency department with redness and swelling to the surgical area.  He reports that he was in his normal state of health until yesterday.  After driving 4 hours he noted some swelling and firmness to the sacral region around the prior surgical area. He was concerned that this was due to a possible infection.  He placed over-the-counter antimicrobial ointment (which is new for him) after taking a hot shower, and then placed heat pad on it.  He reported that after placing the ointment he began having extreme pruritus.  He denied any associated pain.  The swelling and redness has improved significantly since yesterday.  He denies fevers or chills.  Denies any drainage.  Denies any other physical complaints.  HPI  4 hr drive. Noted swelling, redness.  Past Medical History Past Medical History:  Diagnosis Date  . Allergy    Patient Active Problem List   Diagnosis Date Noted  . Pilonidal abscess 11/13/2017  . Smoker 02/24/2015   Home Medication(s) Prior to Admission medications   Medication Sig Start Date End Date Taking? Authorizing Provider  ibuprofen (ADVIL,MOTRIN) 600 MG tablet Take 1 tablet (600 mg total) by mouth every 6 (six) hours as needed for mild pain (pain). 11/16/17   Rayburn, Alphonsus SiasKelly A, PA-C  Multiple Vitamin (MULTIVITAMIN WITH MINERALS) TABS tablet Take 1 tablet by mouth daily.    [provider]  Pseudoephedrine-Naproxen Na (ALEVE-D SINUS & COLD) 120-220 MG TB12 Take 1 tablet by mouth daily as needed (sinus pressure).    [provider]           Past Surgical History Past Surgical History:  Procedure Laterality Date  . FRACTURE SURGERY    . INCISION AND DRAINAGE ABSCESS N/A 11/13/2017   Procedure: INCISION AND DRAINAGE ABSCESS;  Surgeon: Manus Ruddsuei, Matthew, MD;  Location: [facility] OR;  Service: General;  Laterality: N/A;   Family History Family History  Problem Relation Age of Onset  . Diabetes Mother     Social History Social History   Tobacco Use  . Smoking status: Former Smoker    Last attempt to quit: 05/12/2015    Years since quitting: 2.8  . Smokeless tobacco: Never Used  Substance Use Topics  . Alcohol use: Yes    Alcohol/week: 0.0 standard drinks  . Drug use: No   Allergies Patient has no known allergies.  Review of Systems Review of Systems All other systems are reviewed and are negative for acute change except as noted in the HPI  Physical Exam Vital Signs  I have reviewed the triage vital signs BP 117/68 (BP Location: Left Arm)   Pulse 61   Temp 98.2 F (36.8 C) (Oral)   Resp 16   SpO2 99%   Physical Exam Vitals signs reviewed.  Constitutional:      General: He is not in acute distress.    Appearance: He is well-developed. He is not diaphoretic.  HENT:     Head: Normocephalic and atraumatic.     Jaw: No trismus.     Right Ear: External ear normal.     Left Ear: External ear normal.     Nose: Nose normal.  Eyes:     General: No  scleral icterus.    Conjunctiva/sclera: Conjunctivae normal.  Neck:     Musculoskeletal: Normal range of motion.     Trachea: Phonation normal.  Cardiovascular:     Rate and Rhythm: Normal rate and regular rhythm.  Pulmonary:     Effort: Pulmonary effort is normal. No respiratory distress.     Breath sounds: No stridor.  Abdominal:     General: There is no distension.  Musculoskeletal: Normal range of motion.       Back:  Neurological:     Mental Status: He is alert and oriented to person, place, and time.  Psychiatric:        Behavior: Behavior normal.       ED Results and Treatments Labs (all labs ordered are listed, but only abnormal results are displayed) Labs Reviewed - No data to display                                                                                                                       EKG  EKG Interpretation  Date/Time:    Ventricular Rate:    PR Interval:    QRS Duration:   QT Interval:    QTC Calculation:   R Axis:     Text Interpretation:        Radiology No results found. Pertinent labs & imaging results that were available during my care of the patient were reviewed by me and considered in my medical decision making (see chart for details).  Medications Ordered in ED Medications - No data to display                                                                                                                                  Procedures Procedures EMERGENCY DEPARTMENT US SOFT TISSUE INTERPRETATION "Study: Limited Soft Tissue Ultrasound"  INDICATIONS: redness Multiple views of the body part were obtained in real-time with a multi-frequency linear probe  PERFORMED BY: Myself IMAGES ARCHIVED?: Yes SIDE:Midline BODY PART:sacral region INTERPRETATION:  No abcess noted and No cellulitis noted    (including critical care time)  Medical Decision Making / ED Course I have reviewed the nursing notes for this encounter and the patient's prior records (if available in EHR or on provided paperwork).    Patient presents with swelling and redness to the sacral region.  When compared to pictures from yesterday, the swelling and redness has significantly improved.  What remains  is a macular area of what appears to be contact dermatitis, which I suspect is likely from the antibiotic ointment.  The initial swelling the redness was likely due to prolonged sitting. Patient does not have any underlying induration or fluctuance, and does not have any tenderness to palpation that would make me suspicious for a  recurrent abscess or cellulitis.  Bedside ultrasound also did not reveal evidence of cellulitis or abscess.  I did notice a tract from the proximal to distal drain sites.  There is no fluctuance or discharge noted.  Recommended supportive management and refraining from using any more of that specific antibiotic ointment.  Surgery follow-up as needed. The patient is safe for discharge with strict return precautions.   Final Clinical Impression(s) / ED Diagnoses Final diagnoses:  Irritant contact dermatitis due to drug in contact with skin   Disposition: Discharge  Condition: Good  I have discussed the results, Dx and Tx plan with the patient who expressed understanding and agree(s) with the plan. Discharge instructions discussed at great length. The patient was given strict return precautions who verbalized understanding of the instructions. No further questions at time of discharge.    ED Discharge Orders    None       Follow Up: Surgery  Call  As needed, If symptoms do not improve or  worsen      This chart was dictated using voice recognition software.  Despite best efforts to proofread,  errors can occur which can change the documentation meaning.   Nira Conn, MD 03/16/18 [phone]

## 2018-03-16 NOTE — ED Notes (Signed)
Pt notified staff that he is going out to his car.

## 2019-04-06 IMAGING — CT CT ABD-PELV W/ CM
2 of 4 series · 17 of 46 positions shown, 19 images · IV contrast (omnipaque)
Comparison: None.

CLINICAL DATA: Rectal bleeding since this afternoon.

EXAM:
CT ABDOMEN AND PELVIS WITH CONTRAST
TECHNIQUE: Multidetector CT imaging of the abdomen and pelvis was performed
using the standard protocol following bolus administration of
intravenous contrast.
CONTRAST:  100mL OMNIPAQUE IOHEXOL 300 MG/ML  SOLN

[Series 3: a/p w/ 5mm · axial · 0.88mm/px · z∈[+839,+1309]mm · 14 of 104 slices shown, 16 images]
[im 5/104  soft-tissue]
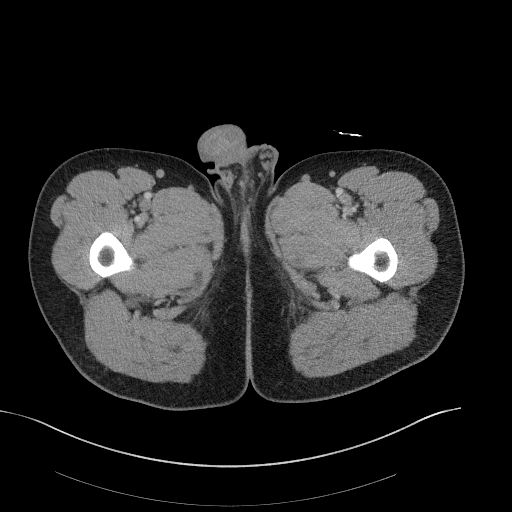
[im 5/104  bone]
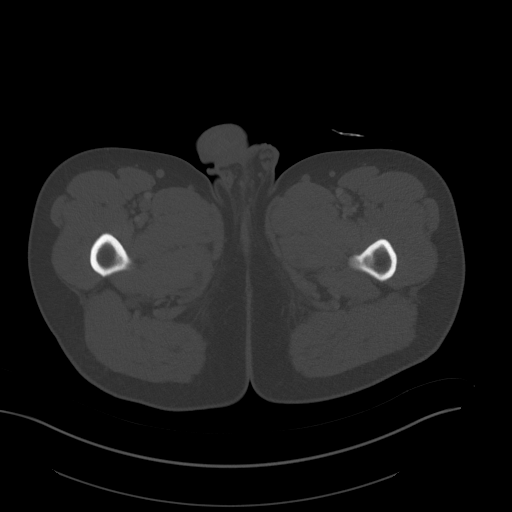
[im 13/104  soft-tissue]
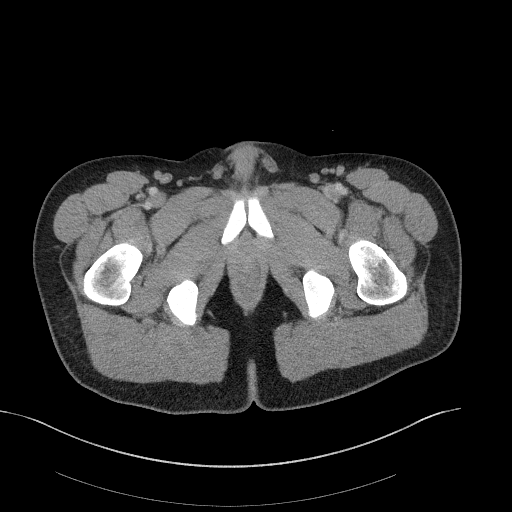
[im 22/104  soft-tissue]
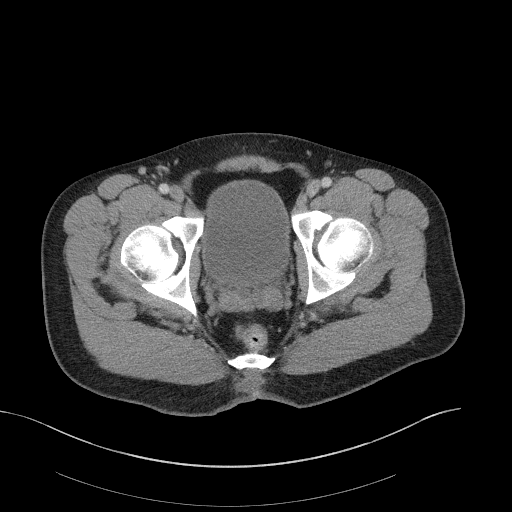
[im 26/104  soft-tissue]
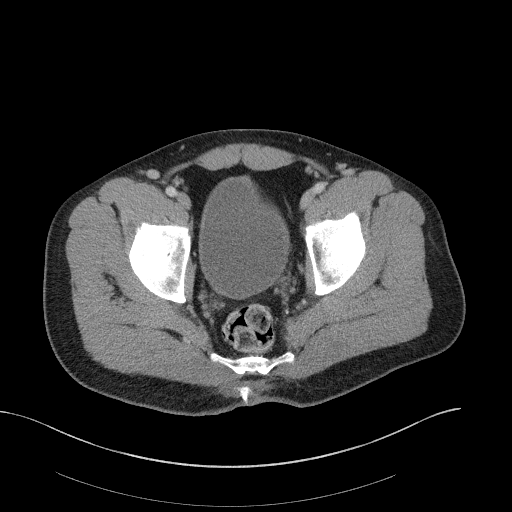
[im 35/104  soft-tissue]
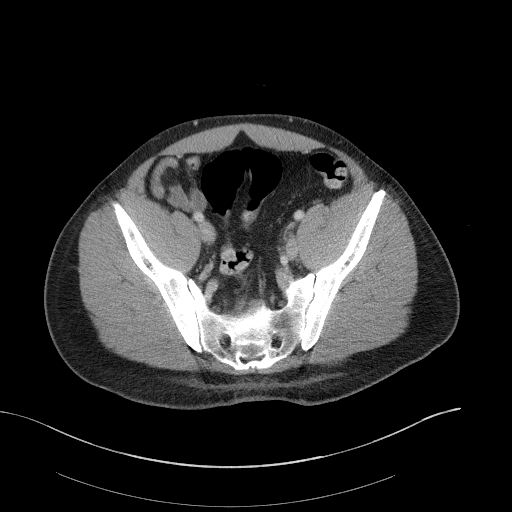
[im 43/104  soft-tissue]
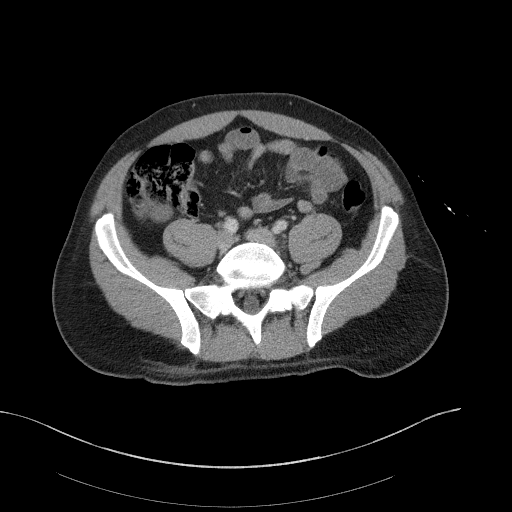
[im 48/104  soft-tissue]
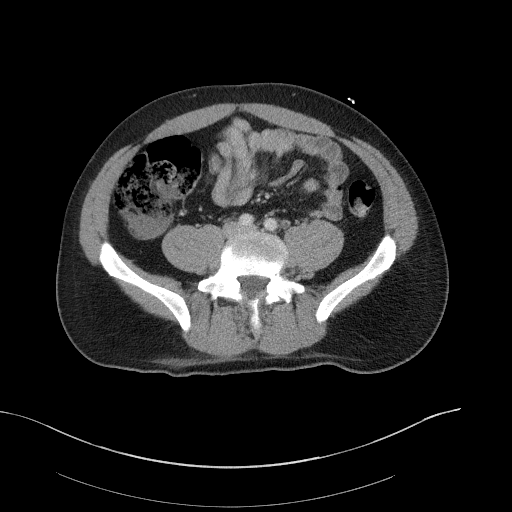
[im 56/104  soft-tissue]
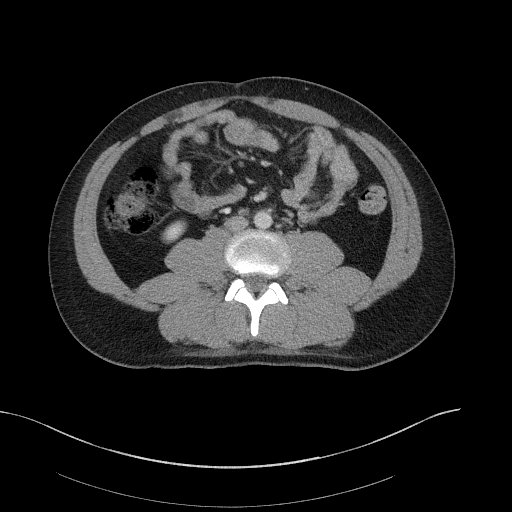
[im 61/104  soft-tissue]
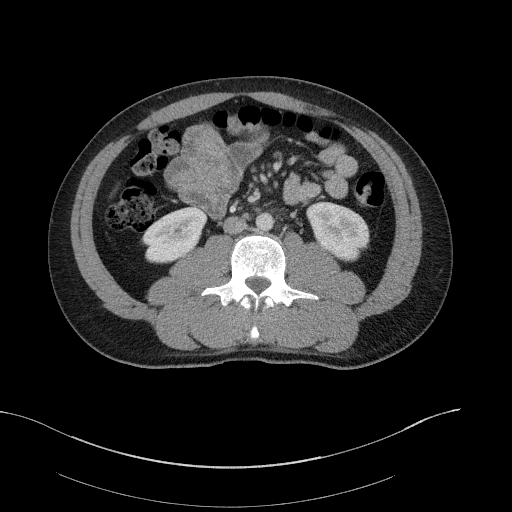
[im 61/104  bone]
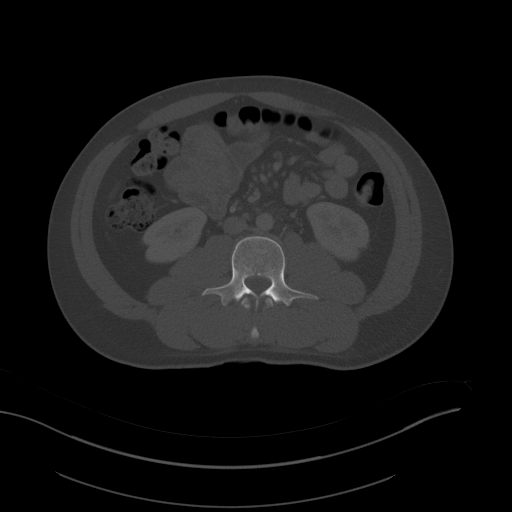
[im 69/104  soft-tissue]
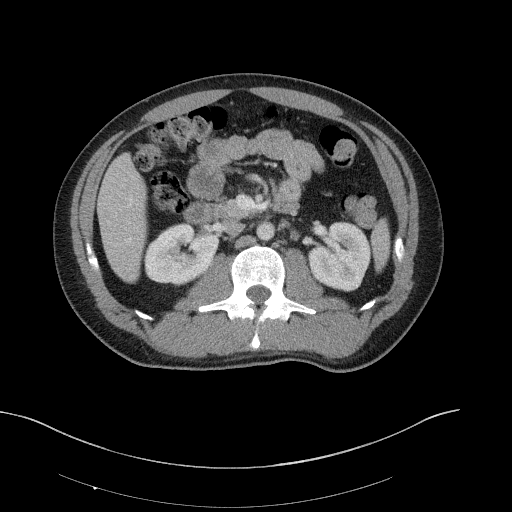
[im 78/104  soft-tissue]
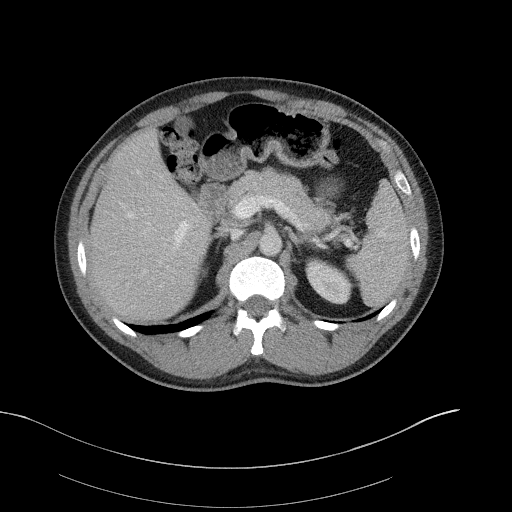
[im 82/104  soft-tissue]
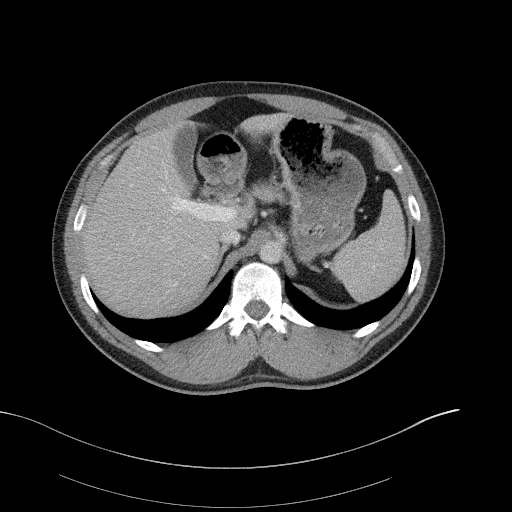
[im 91/104  soft-tissue]
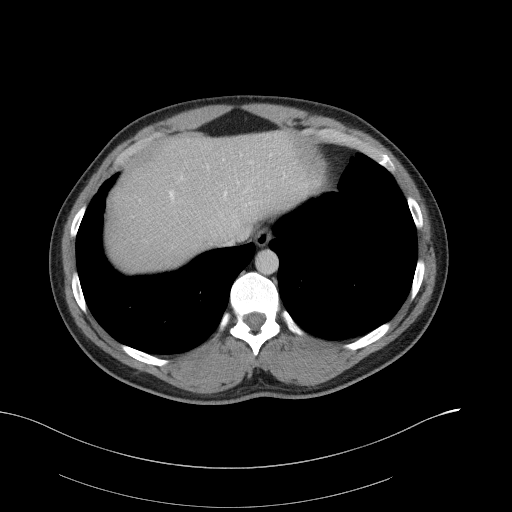
[im 99/104  soft-tissue]
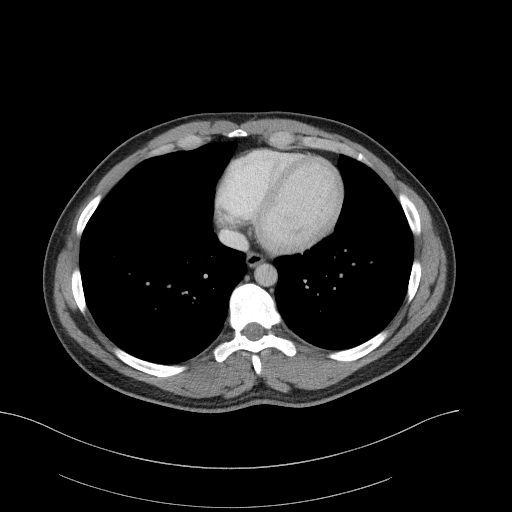

[Series 6: a/p w/ cor · coronal · 0.91mm/px · 3 of 139 slices shown]
[im 47/139  soft-tissue]
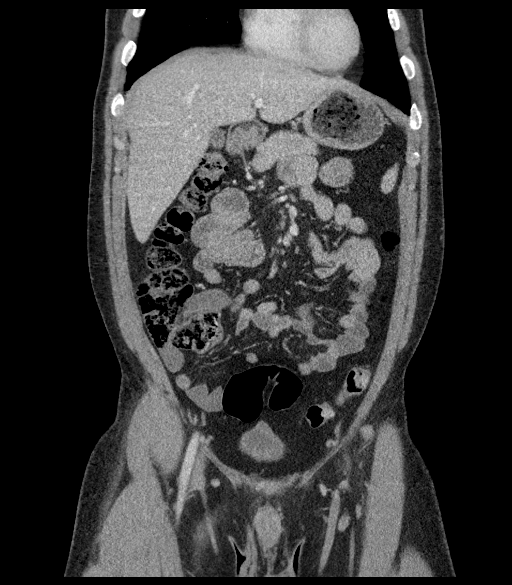
[im 62/139  soft-tissue]
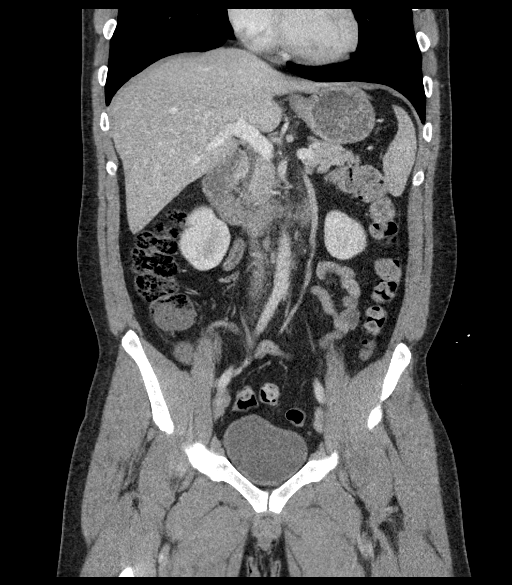
[im 77/139  soft-tissue]
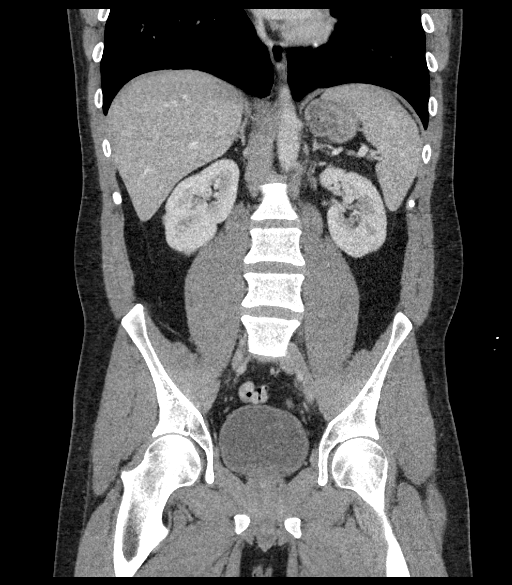

[17 of 46 positions shown; findings below may reference images not displayed]

FINDINGS: Lower chest: Normal.

Hepatobiliary: Normal.

Pancreas: Normal.

Spleen: Normal

Adrenals/Urinary Tract: Adrenal glands are normal. Kidneys normal in
size without hydronephrosis or nephrolithiasis.

Stomach/Bowel: Stomach and small bowel are normal. Appendix is
normal. Colon is normal.

Vascular/Lymphatic: Within normal.

Reproductive: Normal.

Other: No free fluid or focal inflammatory change. No visible
abnormality over the anal rectal region.

Musculoskeletal: Normal.
IMPRESSION: No acute findings in the abdomen/pelvis.

## 2019-06-29 IMAGING — CT CT ABD-PELV W/ CM
2 of 4 series · 15 of 46 positions shown, 17 images · IV contrast (iopamidol)
Comparison: 08/21/2017

CLINICAL DATA: Large abscess on lower back since [REDACTED]. Also
having nausea, vomiting, and fevers. History of similar abscess in
the past. *Pt scanned laying prone, due to location of abscess, pt
stated it was too painful to lie on his back.

EXAM:
CT ABDOMEN AND PELVIS WITH CONTRAST
TECHNIQUE: Multidetector CT imaging of the abdomen and pelvis was performed
using the standard protocol following bolus administration of
intravenous contrast.
CONTRAST:  100mL P35RF2-UBB IOPAMIDOL (P35RF2-UBB) INJECTION 61%

[Series 3: abdomen 5.0 · axial · 0.74mm/px · z∈[-580,-95]mm · 12 of 111 slices shown, 14 images]
[im 7/111  soft-tissue]
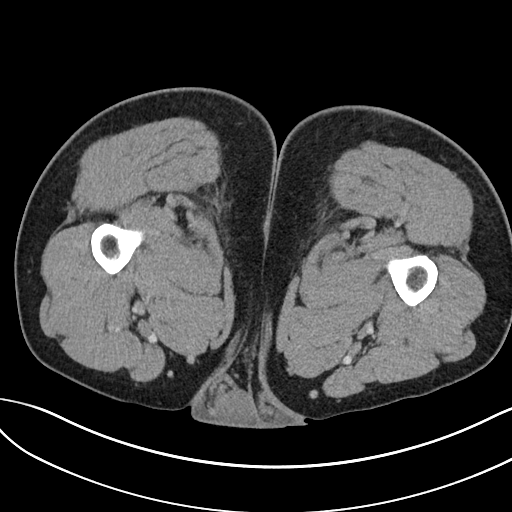
[im 7/111  bone]
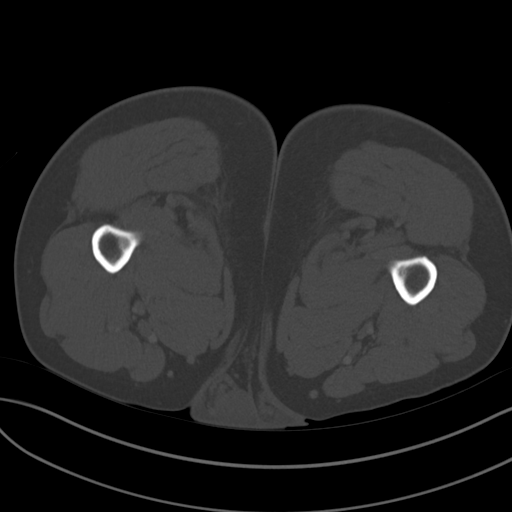
[im 20/111  soft-tissue]
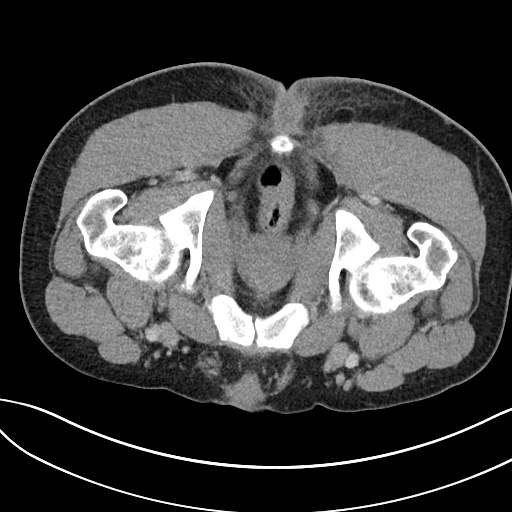
[im 26/111  soft-tissue]
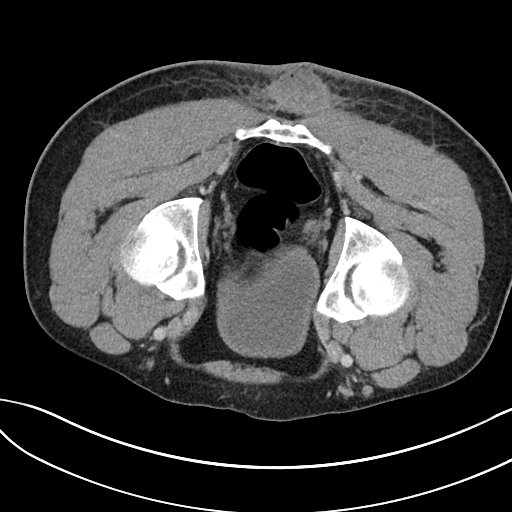
[im 33/111  soft-tissue]
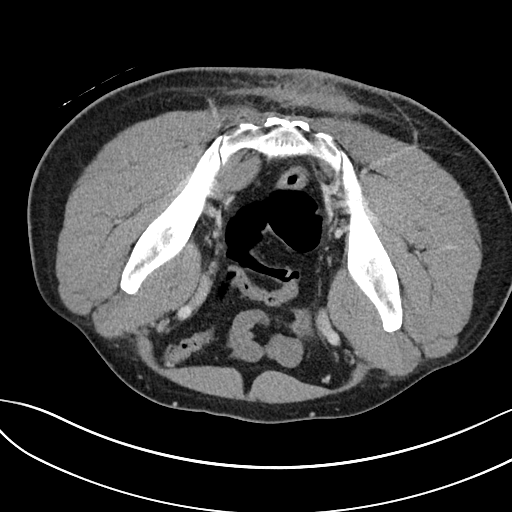
[im 46/111  soft-tissue]
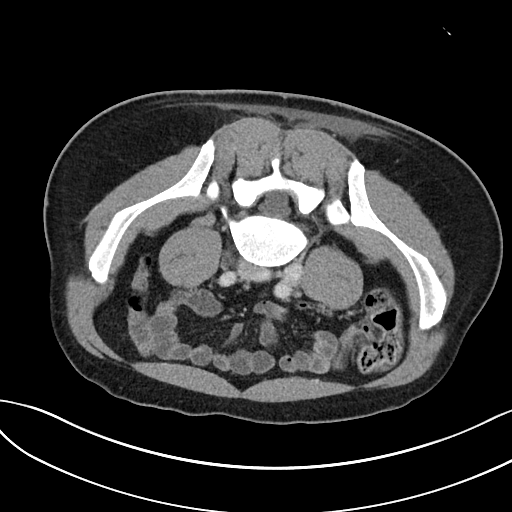
[im 52/111  soft-tissue]
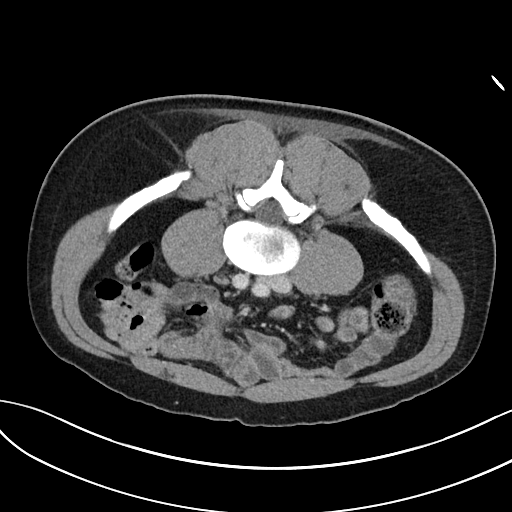
[im 59/111  soft-tissue]
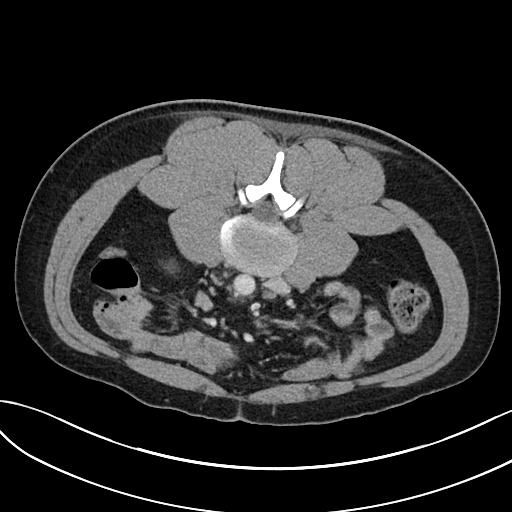
[im 72/111  soft-tissue]
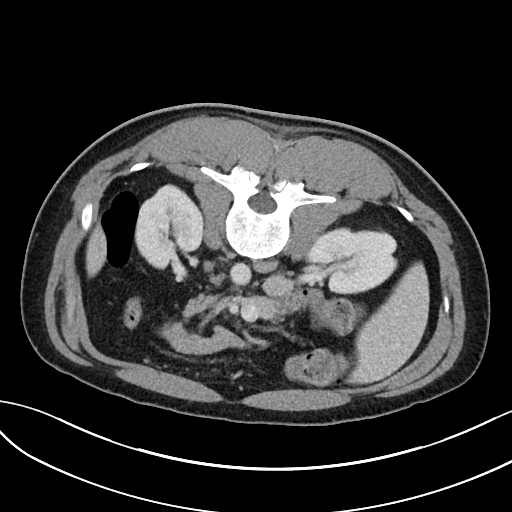
[im 78/111  soft-tissue]
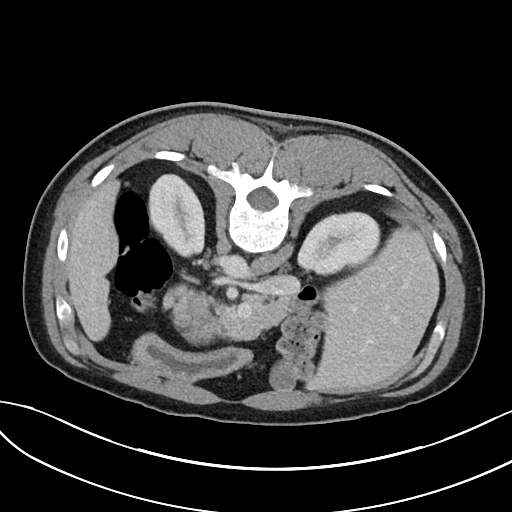
[im 78/111  bone]
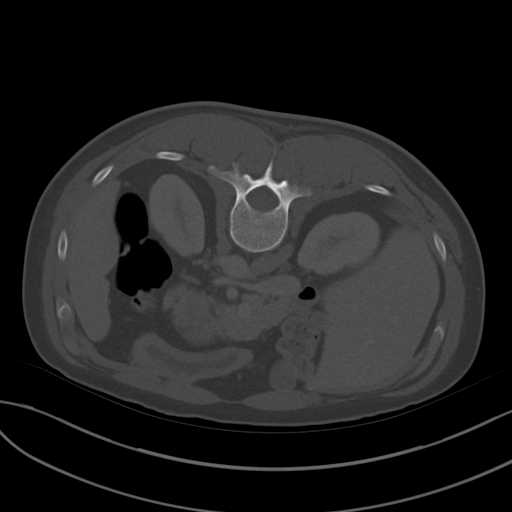
[im 85/111  soft-tissue]
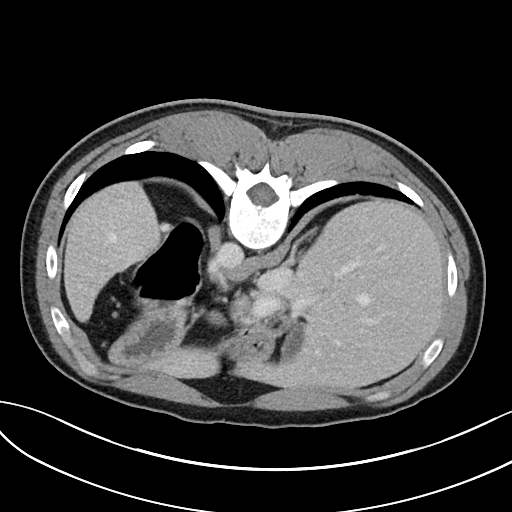
[im 98/111  soft-tissue]
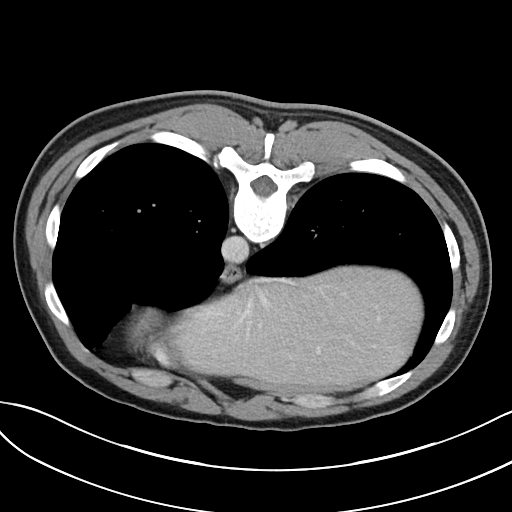
[im 104/111  soft-tissue]
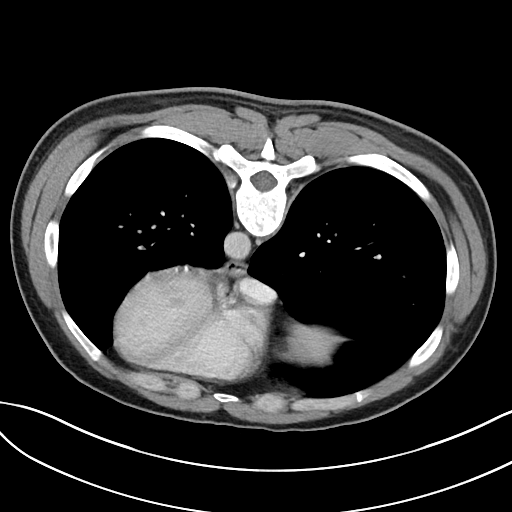

[Series 6: abdomen 3.0 mpr cor · coronal · 0.85mm/px · 3 of 76 slices shown]
[im 26/76  soft-tissue]
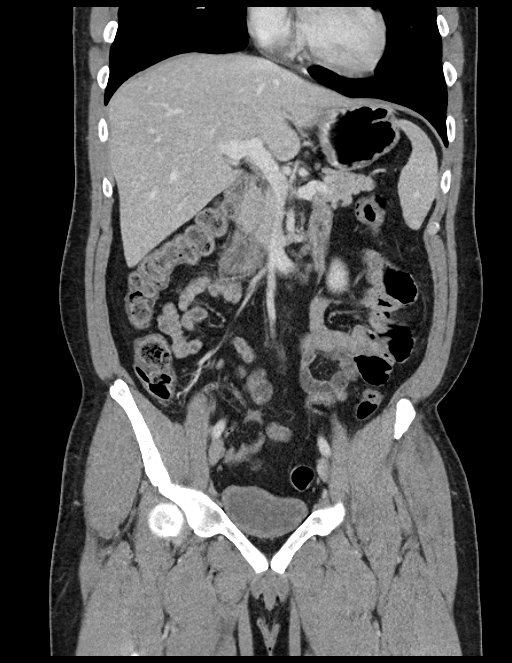
[im 34/76  soft-tissue]
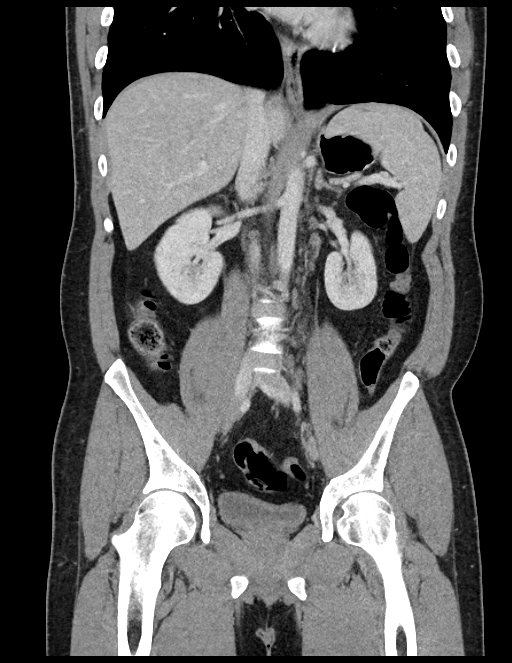
[im 42/76  soft-tissue]
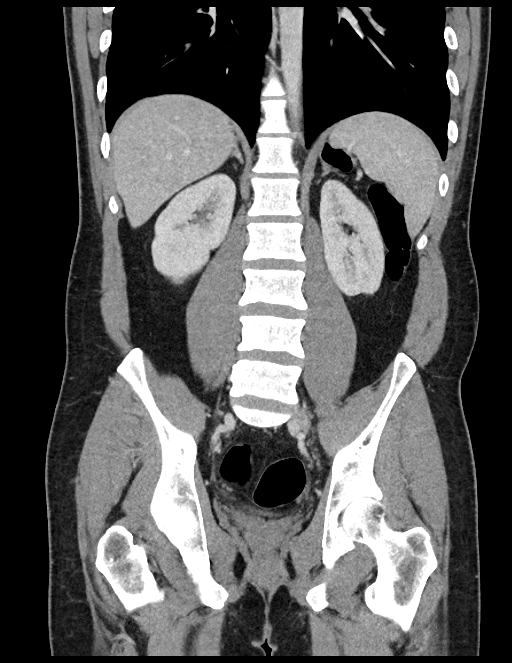

[15 of 46 positions shown; findings below may reference images not displayed]

FINDINGS: Lower chest: No acute abnormality.

Hepatobiliary: No focal liver abnormality is seen. No gallstones,
gallbladder wall thickening, or biliary dilatation.

Pancreas: Unremarkable. No pancreatic ductal dilatation or
surrounding inflammatory changes.

Spleen: Normal in size without focal abnormality.

Adrenals/Urinary Tract: Adrenal glands are unremarkable. Kidneys are
normal, without renal calculi, focal lesion, or hydronephrosis.
Bladder is unremarkable.

Stomach/Bowel: Stomach is within normal limits. Appendix appears
normal. No evidence of bowel wall thickening, distention, or
inflammatory changes.

Vascular/Lymphatic: No significant vascular findings are present. No
enlarged abdominal or pelvic lymph nodes.

Reproductive: Prostate is unremarkable.

Other: No ascites.  No free air.

Musculoskeletal: Infectious/inflammatory process in the deep
subcutaneous tissues Overlying the distal sacrum extending to the
gluteal cleft, centered just to the right of midline, measuring
cm transverse x 8.4 craniocaudal x 3.2 cm anterior-posterior .
Centrally the process contains loculated appearing fluid attenuation
and scattered tiny gas bubbles. There are inflammatory/edematous
changes in the adjacent subcutaneous fat, and some fluid extending
through the subcutaneous tissue planes of the posterior body wall up
to the L1 level. The process approaches the distal sacrum and coccyx
without any definite osseous demineralization to suggest
osteomyelitis.

Negative for fracture or worrisome bone lesion.
IMPRESSION: 1. 8.4 cm loculated abscess in the posterior body wall overlying the
distal sacrum, without CT evidence of osteomyelitis.
2. No acute intra-abdominal process.

## 2022-11-21 ENCOUNTER — Emergency Department (HOSPITAL_COMMUNITY): Payer: Commercial Managed Care - PPO

## 2022-11-21 ENCOUNTER — Emergency Department (HOSPITAL_COMMUNITY)
Admission: EM | Admit: 2022-11-21 | Discharge: 2022-11-21 | Disposition: A | Discharge status: Home / Self Care | Payer: Commercial Managed Care - PPO | Attending: Emergency Medicine | Admitting: Emergency Medicine

## 2022-11-21 ENCOUNTER — Other Ambulatory Visit: Payer: Self-pay

## 2022-11-21 ENCOUNTER — Encounter (HOSPITAL_COMMUNITY): Payer: Self-pay

## 2022-11-21 DIAGNOSIS — R079 Chest pain, unspecified: Secondary | ICD-10-CM | POA: Diagnosis present

## 2022-11-21 DIAGNOSIS — R002 Palpitations: Secondary | ICD-10-CM | POA: Diagnosis not present

## 2022-11-21 DIAGNOSIS — R1031 Right lower quadrant pain: Secondary | ICD-10-CM | POA: Insufficient documentation

## 2022-11-21 DIAGNOSIS — R1032 Left lower quadrant pain: Secondary | ICD-10-CM

## 2022-11-21 LAB — URINALYSIS, ROUTINE W REFLEX MICROSCOPIC
Bilirubin Urine: NEGATIVE
Glucose, UA: NEGATIVE mg/dL
Hgb urine dipstick: NEGATIVE
Ketones, ur: NEGATIVE mg/dL
Leukocytes,Ua: NEGATIVE
Nitrite: NEGATIVE
Protein, ur: NEGATIVE mg/dL
Specific Gravity, Urine: 1.008 (ref 1.005–1.030)
pH: 7 (ref 5.0–8.0)

## 2022-11-21 LAB — HEPATIC FUNCTION PANEL
ALT: 25 U/L (ref 0–44)
AST: 22 U/L (ref 15–41)
Albumin: 4.2 g/dL (ref 3.5–5.0)
Alkaline Phosphatase: 48 U/L (ref 38–126)
Bilirubin, Direct: <0.1 mg/dL (ref 0.0–0.2)
Total Bilirubin: 0.9 mg/dL (ref 0.3–1.2)
Total Protein: 7.3 g/dL (ref 6.5–8.1)

## 2022-11-21 LAB — BASIC METABOLIC PANEL
Anion gap: 10 (ref 5–15)
BUN: 8 mg/dL (ref 6–20)
CO2: 22 mmol/L (ref 22–32)
Calcium: 9 mg/dL (ref 8.9–10.3)
Chloride: 103 mmol/L (ref 98–111)
Creatinine, Ser: 1.02 mg/dL (ref 0.61–1.24)
GFR, Estimated: >60 mL/min (ref >60)
Glucose, Bld: 109 mg/dL — ABNORMAL HIGH (ref 70–99)
Potassium: 3.8 mmol/L (ref 3.5–5.1)
Sodium: 135 mmol/L (ref 135–145)

## 2022-11-21 LAB — CBC
HCT: 46.2 % (ref 39.0–52.0)
Hemoglobin: 15.8 g/dL (ref 13.0–17.0)
MCH: 30.6 pg (ref 26.0–34.0)
MCHC: 34.2 g/dL (ref 30.0–36.0)
MCV: 89.4 fL (ref 80.0–100.0)
Platelets: 186 10*3/uL (ref 150–400)
RBC: 5.17 MIL/uL (ref 4.22–5.81)
RDW: 12.4 % (ref 11.5–15.5)
WBC: 10.1 10*3/uL (ref 4.0–10.5)
nRBC: 0 % (ref 0.0–0.2)

## 2022-11-21 LAB — RAPID URINE DRUG SCREEN, HOSP PERFORMED
Amphetamines: NOT DETECTED
Barbiturates: NOT DETECTED
Benzodiazepines: NOT DETECTED
Cocaine: NOT DETECTED
Opiates: NOT DETECTED
Tetrahydrocannabinol: NOT DETECTED

## 2022-11-21 LAB — TROPONIN I (HIGH SENSITIVITY)
Troponin I (High Sensitivity): 4 ng/L (ref <18)
Troponin I (High Sensitivity): 4 ng/L (ref <18)

## 2022-11-21 LAB — LIPASE, BLOOD: Lipase: 29 U/L (ref 11–51)

## 2022-11-21 NOTE — Discharge Instructions (Addendum)
It was a pleasure taking care of you today. As discussed, your labs were all reassuring. Your cardiac labs were normal. I have placed a referral to a cardiologist. They will call to schedule an appointment for further evaluation. Follow-up with PCP next week for further evaluation of your abdominal pain. All of your abdominal labs were reassuring. Return to the ER for new or worsening symptoms.

## 2022-11-21 NOTE — ED Provider Notes (Signed)
[place] [facility] AT [facility] Provider Note   CSN: [id number] Arrival date & time: 11/21/22  1102     History  Chief Complaint  Patient presents with   Chest Pain    Willie Moore is a 42 y.o. male with no significant past medical history who presents to the ED due to intermittent left-sided chest pain x 5 months.  Pain has worsened over the past 1.5 months.  Notes pain has become more frequent and occurs daily.  Describes pain as a pressure-like sensation to the left side of his chest.  Pain is nonradiating.  Chest pain associate with palpitations.  Notes symptoms started after starting Bupropion.  Stopped taking medication roughly 2 weeks ago with no improvement in symptoms.  No cardiac history.  Denies shortness of breath.  No history of blood clots, recent surgeries, recent long immobilizations, or hormonal treatments.  No lower extremity edema.  Also admits to persistent left-sided abdominal pain x 2 months.  Pain has been ongoing for numerous months however, resolved and then returned 2 months ago.  Patient has been worked up in the outpatient setting with reassuring workup per patient (colonoscopy, CT scan, labs).  Denies urinary symptoms.  No history of kidney stones.  No injury to left side. Denies nausea, vomiting, diarrhea.   History obtained from patient and past medical records. No interpreter used during encounter.       Home Medications Prior to Admission medications   Medication Sig Start Date End Date Taking? Authorizing Provider  ibuprofen (ADVIL,MOTRIN) 600 MG tablet Take 1 tablet (600 mg total) by mouth every 6 (six) hours as needed for mild pain (pain). 11/16/17   Juliet Rude, PA-C  Multiple Vitamin (MULTIVITAMIN WITH MINERALS) TABS tablet Take 1 tablet by mouth daily.    [provider]  Pseudoephedrine-Naproxen Na (ALEVE-D SINUS & COLD) 120-220 MG TB12 Take 1 tablet by mouth daily as needed (sinus pressure).    [provider]      Allergies    Patient has no known allergies.    Review of Systems   Review of Systems  Constitutional:  Negative for fever.  Respiratory:  Negative for shortness of breath.   Cardiovascular:  Positive for chest pain and palpitations. Negative for leg swelling.  Gastrointestinal:  Positive for abdominal pain.    Physical Exam Updated Vital Signs BP 127/81   Pulse 74   Temp 97.7 F (36.5 C)   Resp 17   Ht 6\' 3"  (1.905 m)   Wt 95.3 kg   SpO2 98%   BMI 26.25 kg/m  Physical Exam Vitals and nursing note reviewed.  Constitutional:      General: He is not in acute distress.    Appearance: He is not ill-appearing.  HENT:     Head: Normocephalic.  Eyes:     Pupils: Pupils are equal, round, and reactive to light.  Cardiovascular:     Rate and Rhythm: Normal rate and regular rhythm.     Pulses: Normal pulses.     Heart sounds: Normal heart sounds. No murmur heard.    No friction rub. No gallop.  Pulmonary:     Effort: Pulmonary effort is normal.     Breath sounds: Normal breath sounds.  Abdominal:     General: Abdomen is flat. There is no distension.     Palpations: Abdomen is soft.     Tenderness: There is no abdominal tenderness. There is no guarding or rebound.  Comments: Abdomen soft, nondistended, nontender to palpation in all quadrants without guarding or peritoneal signs. No rebound.   Musculoskeletal:        General: Normal range of motion.     Cervical back: Neck supple.     Comments: No lower extremity edema. Negative homan sign bilaterally  Skin:    General: Skin is warm and dry.  Neurological:     General: No focal deficit present.     Mental Status: He is alert.  Psychiatric:        Mood and Affect: Mood normal.        Behavior: Behavior normal.     ED Results / Procedures / Treatments   Labs (all labs ordered are listed, but only abnormal results are displayed) Labs Reviewed  BASIC METABOLIC PANEL - Abnormal; Notable for the  following components:      Result Value   Glucose, Bld 109 (*)    All other components within normal limits  CBC  URINALYSIS, ROUTINE W REFLEX MICROSCOPIC  RAPID URINE DRUG SCREEN, HOSP PERFORMED  LIPASE, BLOOD  HEPATIC FUNCTION PANEL  TROPONIN I (HIGH SENSITIVITY)  TROPONIN I (HIGH SENSITIVITY)    EKG EKG Interpretation Date/Time:  Saturday November 21 2022 11:18:02 EDT Ventricular Rate:  80 PR Interval:  140 QRS Duration:  80 QT Interval:  372 QTC Calculation: 429 R Axis:   90  Text Interpretation: Normal sinus rhythm Rightward axis Borderline ECG No previous ECGs available Confirmed by Kristine Royal [phone]) on 11/21/2022 11:34:35 AM  Radiology DG Chest 2 View  Result Date: 11/21/2022 CLINICAL DATA:  Chest pain EXAM: CHEST - 2 VIEW COMPARISON:  None Available. FINDINGS: The heart size and mediastinal contours are within normal limits. Both lungs are clear. The visualized skeletal structures are unremarkable. IMPRESSION: Negative two view chest x-ray Electronically Signed   By: Marin Roberts M.D.   On: 11/21/2022 12:03    Procedures Procedures    Medications Ordered in ED Medications - No data to display  ED Course/ Medical Decision Making/ A&P                                 Medical Decision Making Amount and/or Complexity of Data Reviewed Labs: ordered. Decision-making details documented in ED Course. Radiology: ordered and independent interpretation performed. Decision-making details documented in ED Course. ECG/medicine tests: ordered and independent interpretation performed. Decision-making details documented in ED Course.   This patient presents to the ED for concern of CP and abdominal pain, this involves an extensive number of treatment options, and is a complaint that carries with it a high risk of complications and morbidity.  The differential diagnosis includes ACS, PE, aortic dissection, MSK etiology, anxiety, etc  42 year old male presents to the  ED due to persistent left-sided chest pain x 5 months.  Also admits to left-sided abdominal pain x 2 months.  Patient has been worked up in the outpatient setting for his abdominal pain with reassuring workup per patient.  Chest pain associated with palpitations.  No shortness of breath.  No history of blood clots.  Upon arrival, vitals all within normal limits.  Patient in no acute distress.  Reassuring physical exam.  Lungs clear to auscultation bilaterally.  No lower extremity edema.  Negative Homan sign bilaterally.  Abdomen soft, nondistended, nontender.  Low suspicion for acute abdomen so we will hold off on CT abdomen at this time.  Routine labs ordered.  Troponin to rule out ACS.  Chest x-ray to rule out evidence of pneumonia or other etiologies of chest pain.  Patient will likely need a referral to cardiology for a Holter monitor. PERC negative and low risk using well criteria, low suspicion for PE/DVT. Presentation nonconcerning for aortic dissection.   CBC unremarkable.  No leukocytosis.  Normal hemoglobin.  BMP reassuring.  Normal renal function.  No major electrolyte derangements.  Lipase normal.  Low suspicion for pancreatitis.  hepatic function panel normal.  UDS negative.  UA negative.  Low suspicion for pyelonephritis.  Chest x-ray personally reviewed and interpreted which is negative for signs of pneumonia, pneumothorax, or widened mediastinum.  EKG demonstrates normal sinus rhythm.  No signs of acute ischemia. Troponin x2 normal. Low suspicion for ACS. Low suspicion for PE/DVT. Patient's abdomen is nontender on exam. Low suspicion for acute abdomen.  Cardiology referral placed for patient for further evaluation of chest pain however, appears atypical in nature.  Advised patient follow-up with PCP in regards to his chronic abdominal pain.  Patient stable for discharge.  Patient able to tolerate p.o. without difficulty. Strict ED precautions discussed with patient. Patient states understanding and  agrees to plan. Patient discharged home in no acute distress and stable vitals   Lives at home No PCP       Final Clinical Impression(s) / ED Diagnoses Final diagnoses:  Nonspecific chest pain  Left lower quadrant abdominal pain    Rx / DC Orders ED Discharge Orders          Ordered    Ambulatory referral to Cardiology        11/21/22 [address] 11/21/22 1517    Wynetta Fines, MD 11/21/22 1615

## 2022-11-21 NOTE — ED Triage Notes (Signed)
Pt arrived POV from home c/o CP x5 months. Pt states it has been frequent, and feels like  his heart is beating out of his chest. Pt sates he was started on a new medication months ago and that is when these symptoms started. Pt states he also feels fatigued.

## 2022-11-21 NOTE — ED Notes (Signed)
Pt verbalized understanding of discharge instructions. Pt ambulated from ed with steady gait.  

## 2023-02-09 ENCOUNTER — Ambulatory Visit: Payer: Commercial Managed Care - PPO | Admitting: Cardiology

## 2023-04-05 ENCOUNTER — Ambulatory Visit: Payer: Commercial Managed Care - PPO | Attending: Cardiology | Admitting: Cardiology
